# Patient Record
Sex: Female | Born: 1955
Health system: Southern US, Community
[De-identification: ages and names within clinical notes are randomized; demographics above are authoritative.]

## PROBLEM LIST (undated history)

## (undated) DIAGNOSIS — Z9889 Other specified postprocedural states: Secondary | ICD-10-CM

## (undated) DIAGNOSIS — N644 Mastodynia: Secondary | ICD-10-CM

## (undated) DIAGNOSIS — R112 Nausea with vomiting, unspecified: Secondary | ICD-10-CM

## (undated) DIAGNOSIS — D72819 Decreased white blood cell count, unspecified: Secondary | ICD-10-CM

## (undated) DIAGNOSIS — K222 Esophageal obstruction: Secondary | ICD-10-CM

## (undated) DIAGNOSIS — E785 Hyperlipidemia, unspecified: Secondary | ICD-10-CM

## (undated) DIAGNOSIS — K219 Gastro-esophageal reflux disease without esophagitis: Secondary | ICD-10-CM

## (undated) DIAGNOSIS — S52521A Torus fracture of lower end of right radius, initial encounter for closed fracture: Secondary | ICD-10-CM

## (undated) DIAGNOSIS — I10 Essential (primary) hypertension: Secondary | ICD-10-CM

## (undated) DIAGNOSIS — S52101A Unspecified fracture of upper end of right radius, initial encounter for closed fracture: Secondary | ICD-10-CM

## (undated) DIAGNOSIS — D649 Anemia, unspecified: Secondary | ICD-10-CM

## (undated) DIAGNOSIS — D131 Benign neoplasm of stomach: Secondary | ICD-10-CM

## (undated) DIAGNOSIS — Z86010 Personal history of colonic polyps: Secondary | ICD-10-CM

## (undated) HISTORY — DX: Esophageal obstruction: K21.9

## (undated) HISTORY — PX: COLONOSCOPY: SHX174

## (undated) HISTORY — DX: Hyperlipidemia, unspecified: E78.5

## (undated) HISTORY — DX: Esophageal obstruction: K22.2

## (undated) HISTORY — DX: Essential (primary) hypertension: I10

## (undated) HISTORY — PX: TONSILLECTOMY: SUR1361

## (undated) HISTORY — DX: Anemia, unspecified: D64.9

## (undated) HISTORY — DX: Decreased white blood cell count, unspecified: D72.819

## (undated) HISTORY — DX: Benign neoplasm of stomach: D13.1

---
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
## 1991-02-27 HISTORY — PX: TUBAL LIGATION: SHX77

## 1998-02-26 DIAGNOSIS — I10 Essential (primary) hypertension: Secondary | ICD-10-CM | POA: Insufficient documentation

## 1999-01-05 ENCOUNTER — Encounter: Admission: RE | Admit: 1999-01-05 | Discharge: 1999-01-05 | Payer: Self-pay | Admitting: Obstetrics and Gynecology

## 1999-01-05 ENCOUNTER — Encounter: Payer: Self-pay | Admitting: Obstetrics and Gynecology

## 2000-01-03 ENCOUNTER — Other Ambulatory Visit: Admission: RE | Admit: 2000-01-03 | Discharge: 2000-01-03 | Payer: Self-pay | Admitting: Obstetrics and Gynecology

## 2000-01-03 ENCOUNTER — Encounter (INDEPENDENT_AMBULATORY_CARE_PROVIDER_SITE_OTHER): Payer: Self-pay

## 2000-01-09 ENCOUNTER — Encounter: Payer: Self-pay | Admitting: Obstetrics and Gynecology

## 2000-01-09 ENCOUNTER — Encounter: Admission: RE | Admit: 2000-01-09 | Discharge: 2000-01-09 | Payer: Self-pay | Admitting: Obstetrics and Gynecology

## 2000-01-10 ENCOUNTER — Encounter: Admission: RE | Admit: 2000-01-10 | Discharge: 2000-01-10 | Payer: Self-pay | Admitting: Obstetrics and Gynecology

## 2000-01-10 ENCOUNTER — Encounter: Payer: Self-pay | Admitting: Obstetrics and Gynecology

## 2001-01-13 ENCOUNTER — Encounter: Payer: Self-pay | Admitting: Obstetrics and Gynecology

## 2001-01-13 ENCOUNTER — Encounter: Admission: RE | Admit: 2001-01-13 | Discharge: 2001-01-13 | Payer: Self-pay | Admitting: Obstetrics and Gynecology

## 2002-02-05 ENCOUNTER — Encounter: Admission: RE | Admit: 2002-02-05 | Discharge: 2002-02-05 | Payer: Self-pay | Admitting: Obstetrics and Gynecology

## 2002-02-05 ENCOUNTER — Encounter: Payer: Self-pay | Admitting: Obstetrics and Gynecology

## 2003-03-08 ENCOUNTER — Encounter: Admission: RE | Admit: 2003-03-08 | Discharge: 2003-03-08 | Payer: Self-pay | Admitting: Obstetrics and Gynecology

## 2004-03-30 ENCOUNTER — Ambulatory Visit (HOSPITAL_COMMUNITY): Admission: RE | Admit: 2004-03-30 | Discharge: 2004-03-30 | Payer: Self-pay | Admitting: Obstetrics and Gynecology

## 2005-04-09 ENCOUNTER — Encounter: Admission: RE | Admit: 2005-04-09 | Discharge: 2005-04-09 | Payer: Self-pay | Admitting: Obstetrics and Gynecology

## 2005-10-31 ENCOUNTER — Ambulatory Visit (HOSPITAL_COMMUNITY): Admission: RE | Admit: 2005-10-31 | Discharge: 2005-10-31 | Payer: Self-pay | Admitting: Internal Medicine

## 2006-04-15 ENCOUNTER — Ambulatory Visit (HOSPITAL_COMMUNITY): Admission: RE | Admit: 2006-04-15 | Discharge: 2006-04-15 | Payer: Self-pay | Admitting: Obstetrics and Gynecology

## 2007-04-17 ENCOUNTER — Ambulatory Visit (HOSPITAL_COMMUNITY): Admission: RE | Admit: 2007-04-17 | Discharge: 2007-04-17 | Payer: Self-pay | Admitting: Obstetrics and Gynecology

## 2008-02-27 HISTORY — PX: ESOPHAGOGASTRODUODENOSCOPY: SHX1529

## 2008-02-27 HISTORY — PX: COLONOSCOPY: SHX174

## 2008-04-19 ENCOUNTER — Ambulatory Visit (HOSPITAL_COMMUNITY): Admission: RE | Admit: 2008-04-19 | Discharge: 2008-04-19 | Payer: Self-pay | Admitting: Obstetrics and Gynecology

## 2008-05-07 ENCOUNTER — Ambulatory Visit: Payer: Self-pay | Admitting: Internal Medicine

## 2008-05-11 ENCOUNTER — Ambulatory Visit: Payer: Self-pay | Admitting: Gastroenterology

## 2008-05-11 DIAGNOSIS — R1011 Right upper quadrant pain: Secondary | ICD-10-CM | POA: Insufficient documentation

## 2008-05-12 ENCOUNTER — Ambulatory Visit (HOSPITAL_COMMUNITY): Admission: RE | Admit: 2008-05-12 | Discharge: 2008-05-12 | Payer: Self-pay | Admitting: Gastroenterology

## 2008-05-12 ENCOUNTER — Telehealth: Payer: Self-pay | Admitting: Internal Medicine

## 2008-05-13 ENCOUNTER — Ambulatory Visit: Payer: Self-pay | Admitting: Nurse Practitioner

## 2008-05-13 LAB — CONVERTED CEMR LAB
Anti Nuclear Antibody(ANA): NEGATIVE
HCV Ab: NEGATIVE

## 2008-05-14 ENCOUNTER — Telehealth: Payer: Self-pay | Admitting: Nurse Practitioner

## 2008-05-14 LAB — CONVERTED CEMR LAB
Albumin: 4.1 g/dL (ref 3.5–5.2)
Alkaline Phosphatase: 56 units/L (ref 39–117)
Bilirubin, Direct: 0.1 mg/dL (ref 0.0–0.3)
Ferritin: 49.2 ng/mL (ref 10.0–291.0)
HCT: 37.8 % (ref 36.0–46.0)
Iron: 58 ug/dL (ref 42–145)
Lymphs Abs: 1.6 10*3/uL (ref 0.7–4.0)
MCHC: 34.2 g/dL (ref 30.0–36.0)
MCV: 92.1 fL (ref 78.0–100.0)
Monocytes Absolute: 0.6 10*3/uL (ref 0.1–1.0)
Neutrophils Relative %: 19.1 % — ABNORMAL LOW (ref 43.0–77.0)
Platelets: 295 10*3/uL (ref 150.0–400.0)
RDW: 11.9 % (ref 11.5–14.6)
Saturation Ratios: 15.4 % — ABNORMAL LOW (ref 20.0–50.0)
Total Bilirubin: 0.8 mg/dL (ref 0.3–1.2)
Transferrin: 268.6 mg/dL (ref 212.0–360.0)
WBC: 2.9 10*3/uL — ABNORMAL LOW (ref 4.5–10.5)

## 2008-05-18 ENCOUNTER — Ambulatory Visit: Payer: Self-pay | Admitting: Internal Medicine

## 2008-05-21 ENCOUNTER — Ambulatory Visit: Payer: Self-pay | Admitting: Internal Medicine

## 2008-05-24 ENCOUNTER — Telehealth (INDEPENDENT_AMBULATORY_CARE_PROVIDER_SITE_OTHER): Payer: Self-pay | Admitting: *Deleted

## 2008-08-02 ENCOUNTER — Ambulatory Visit: Payer: Self-pay | Admitting: Internal Medicine

## 2008-12-20 DIAGNOSIS — M1990 Unspecified osteoarthritis, unspecified site: Secondary | ICD-10-CM | POA: Insufficient documentation

## 2008-12-20 DIAGNOSIS — Z780 Asymptomatic menopausal state: Secondary | ICD-10-CM | POA: Insufficient documentation

## 2008-12-20 DIAGNOSIS — I10 Essential (primary) hypertension: Secondary | ICD-10-CM | POA: Insufficient documentation

## 2008-12-20 DIAGNOSIS — E785 Hyperlipidemia, unspecified: Secondary | ICD-10-CM | POA: Insufficient documentation

## 2008-12-28 DIAGNOSIS — K760 Fatty (change of) liver, not elsewhere classified: Secondary | ICD-10-CM | POA: Insufficient documentation

## 2008-12-28 DIAGNOSIS — D229 Melanocytic nevi, unspecified: Secondary | ICD-10-CM | POA: Insufficient documentation

## 2009-04-20 ENCOUNTER — Ambulatory Visit (HOSPITAL_COMMUNITY): Admission: RE | Admit: 2009-04-20 | Discharge: 2009-04-20 | Payer: Self-pay | Admitting: Obstetrics and Gynecology

## 2009-10-13 DIAGNOSIS — H8110 Benign paroxysmal vertigo, unspecified ear: Secondary | ICD-10-CM | POA: Insufficient documentation

## 2010-03-16 ENCOUNTER — Other Ambulatory Visit (HOSPITAL_COMMUNITY): Payer: Self-pay | Admitting: Obstetrics and Gynecology

## 2010-03-16 DIAGNOSIS — Z1231 Encounter for screening mammogram for malignant neoplasm of breast: Secondary | ICD-10-CM

## 2010-03-16 DIAGNOSIS — Z0000 Encounter for general adult medical examination without abnormal findings: Secondary | ICD-10-CM

## 2010-04-21 ENCOUNTER — Ambulatory Visit (HOSPITAL_COMMUNITY)
Admission: RE | Admit: 2010-04-21 | Discharge: 2010-04-21 | Disposition: A | Discharge status: Home / Self Care | Payer: 59 | Source: Ambulatory Visit | Attending: Obstetrics and Gynecology | Admitting: Obstetrics and Gynecology

## 2010-04-21 DIAGNOSIS — Z1231 Encounter for screening mammogram for malignant neoplasm of breast: Secondary | ICD-10-CM | POA: Insufficient documentation

## 2011-01-25 ENCOUNTER — Telehealth: Payer: Self-pay | Admitting: Oncology

## 2011-01-25 NOTE — Telephone Encounter (Signed)
S/w the pt and she is aware of the new pt appts on 01/31/2011 with dr Clelia Croft. Pt requested a sooner appt than the week of 02/05/2011. Pt stated she has already waited over two weeks for this appt.

## 2011-01-26 ENCOUNTER — Telehealth: Payer: Self-pay | Admitting: *Deleted

## 2011-01-26 NOTE — Telephone Encounter (Signed)
Pt aware of 01-31-11 appointment

## 2011-01-29 ENCOUNTER — Other Ambulatory Visit: Payer: Self-pay

## 2011-01-31 ENCOUNTER — Other Ambulatory Visit: Payer: 59 | Admitting: Lab

## 2011-01-31 ENCOUNTER — Ambulatory Visit: Payer: 59

## 2011-01-31 ENCOUNTER — Other Ambulatory Visit (HOSPITAL_BASED_OUTPATIENT_CLINIC_OR_DEPARTMENT_OTHER): Payer: Commercial Managed Care - PPO | Admitting: Lab

## 2011-01-31 ENCOUNTER — Encounter: Payer: Self-pay | Admitting: Family

## 2011-01-31 ENCOUNTER — Ambulatory Visit: Payer: 59 | Admitting: Oncology

## 2011-01-31 ENCOUNTER — Ambulatory Visit (HOSPITAL_BASED_OUTPATIENT_CLINIC_OR_DEPARTMENT_OTHER): Payer: 59

## 2011-01-31 ENCOUNTER — Encounter: Payer: Self-pay | Admitting: Hematology & Oncology

## 2011-01-31 ENCOUNTER — Ambulatory Visit (HOSPITAL_BASED_OUTPATIENT_CLINIC_OR_DEPARTMENT_OTHER): Payer: Commercial Managed Care - PPO | Admitting: Hematology & Oncology

## 2011-01-31 ENCOUNTER — Ambulatory Visit: Payer: 59 | Admitting: Family

## 2011-01-31 VITALS — BP 141/89 | HR 93 | Temp 97.9°F | Ht 62.5 in | Wt 188.0 lb

## 2011-01-31 DIAGNOSIS — D72819 Decreased white blood cell count, unspecified: Secondary | ICD-10-CM | POA: Insufficient documentation

## 2011-01-31 HISTORY — DX: Decreased white blood cell count, unspecified: D72.819

## 2011-01-31 LAB — CBC WITH DIFFERENTIAL (CANCER CENTER ONLY)
EOS%: 2.2 % (ref 0.0–7.0)
HGB: 13.6 g/dL (ref 11.6–15.9)
LYMPH#: 1.9 10*3/uL (ref 0.9–3.3)
LYMPH%: 45.9 % (ref 14.0–48.0)
MCH: 30.2 pg (ref 26.0–34.0)
MCHC: 34.9 g/dL (ref 32.0–36.0)
MONO%: 18.4 % — ABNORMAL HIGH (ref 0.0–13.0)
NEUT%: 33 % — ABNORMAL LOW (ref 39.6–80.0)
RDW: 13.3 % (ref 11.1–15.7)

## 2011-01-31 LAB — CHCC SATELLITE - SMEAR

## 2011-01-31 NOTE — Progress Notes (Signed)
Pt seen with Dr. Myna Hidalgo as a new pt. Note  And LOS will be added by him.

## 2011-02-01 LAB — ANA: Anti Nuclear Antibody(ANA): NEGATIVE

## 2011-02-01 NOTE — Progress Notes (Signed)
CC:   [name], MD  DIAGNOSIS:  Transient leukopenia.  HISTORY OF PRESENT ILLNESS:  Ms. Burditt is a very nice 55 year old white female.  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  She actually went to high school with my wife at [employer].  The patient is followed by Dr. Timothy Lasso.  Dr. Timothy Lasso has noted that she has had progressive leukopenia over the past few months.  Ms. Fauteux has felt well.  She has not noticed any problems with recurrent infections.  She has had no change in her medications.  There have been no rashes.  She does not feel any swollen lymph nodes.  There has been no foreign travel.  In going through her records, back on 01/03/2011, her white cell count was 2.5.  Her hemoglobin was 13.5; hematocrit was 39.3.  Platelet count was 344.  Her white cell differential showed 22 segs, 16 lymphocytes.  She had normal electrolytes.  LFTs were normal.  Again, she had not had any issues with respect to infections.  Dr. Timothy Lasso felt that hematologic evaluation was indicated.  He kindly sent her to the [facility] for evaluation.  Of note, going back through the record, back in March 2010, her white cell count was 2.9.  PAST MEDICAL HISTORY: 1. Remarkable for hypertension. 2. Hyperlipidemia. 3. Irritable bowel syndrome. 4. Fatty liver.  ALLERGIES:  SULFA MEDICATIONS.  MEDICATIONS: 1. Crestor 5 mg p.o. daily. 2. Micardis 80 mg p.o. daily. 3. Norvasc 2.5 mg p.o. daily.  SOCIAL HISTORY:  Negative for tobacco use.  There is no alcohol use. She has no obvious occupational exposures.  FAMILY HISTORY:  Relatively noncontributory.  She is not aware of any blood issues within her family.  She has 2 kids who are quite healthy. Her mother did have a history of high blood pressure and a history of breast cancer.  REVIEW OF SYSTEMS:  As stated in the history of present illness.  Her last mammogram was back in February 2012.  Her last  colonoscopy was back in March of 2010.  PHYSICAL EXAM:  General:  This is a well-developed well-nourished white female in no obvious distress.  Vital Signs:  Temperature of 97.9, pulse 93, respiratory rate 18, blood pressure 141/89.  Weight is 188. Head/Neck:  Exam shows a normocephalic, atraumatic skull.  There are no ocular or oral lesions.  There are no palpable cervical or supraclavicular lymph nodes.  Lungs:  Clear bilaterally.  No rales, wheezes or rhonchi.  Cardiac:  Regular rate and rhythm with a normal S1, S2.  There are no murmurs, rubs, or bruits.  Abdomen:  Soft with good bowel sounds.  There is no palpable abdominal mass.  There is no fluid wave.  There is no palpable hepatosplenomegaly.  Back:  No tenderness of the spine, ribs, or hips.  Extremities:  No clubbing, cyanosis or edema. She has good range of motion of her joints.  She has good pulses in her distal extremities.  There is no joint swelling, erythema or warmth. Axillae:  Axillary exam shows no bilateral axillary adenopathy. Neurologic:  Exam shows no focal neurological deficits.  Skin:  Exam shows no rashes, ecchymosis or petechiae.  LABORATORY STUDIES:  White cell count 4.1, hemoglobin 13.6, hematocrit 39, platelet count 354.  MCV is 87.  White cell differential shows 33 segs, 46 lymphocytes, 18 monos.  Her ANA is negative.  Her rheumatoid factor is also negative.  Peripheral smear shows a normochromic, normocytic  population of red blood cells.  There are no nucleated red blood cells.  I see no teardrop cells.  She has no rouleaux formation. There are no target cells.  White cells appear normal morphology and maturation.  There is an increase in lymphocytes.  Lymphocytes appear normal without any large granules or atypia.  There may be a slight increase in monocytes.  I do not see any hypersegmented polys.  There are no blasts.  Platelets are adequate number and size.  Platelets are well  granulated.  IMPRESSION:  Ms. Swinford is a very nice 55 year old white female.  Again, she went to high school with my wife.  It was fun talking to her about that.  I really do not see anything with respect to her exam or her blood smear that looks unusual.  She does have an increase in lymphocytes and monocytes.  I am wondering if she may have had, for some reason, marrow suppression.  It is hard to say what might have caused that.  She may have had some kind of transient viral syndrome that could have led to the leukopenia.  The monocytosis would go along with a viral type process.  Again, I really do not see anything on her exam that looks suspicious. She does not have hepatosplenomegaly, lymphadenopathy, or any unusual rashes.  The lab work that we have on her so far looks fine.  I do not see an indication for a bone marrow test.  I think we could be conservative and just follow Ms. Aguilar.  I want to see her back in about 3 or 4 months.  If we find that her blood counts are stable or better, then we can hopefully let her go from the clinic as we really would not be adding much to her medical care.  I did give Ms. Napier a prayer blanket.  She very much appreciated this. She has a strong faith.  We did have good fellowship during our office meeting.  I spent a good hour so with Ms. Cavendish.  Again, it was fun talking to her.    ______________________________ Josph Macho, M.D. PRE/MEDQ  D:  02/01/2011  T:  02/01/2011  Job:  [id number]   ADDENDUM:  (-) ANA and RF.

## 2011-02-01 NOTE — Progress Notes (Signed)
This office note has been dictated.

## 2011-02-02 ENCOUNTER — Telehealth: Payer: Self-pay | Admitting: *Deleted

## 2011-02-02 NOTE — Telephone Encounter (Signed)
Mailed 04-2011 schedule

## 2011-02-07 ENCOUNTER — Ambulatory Visit: Payer: 59

## 2011-02-07 ENCOUNTER — Ambulatory Visit: Payer: 59 | Admitting: Oncology

## 2011-02-07 ENCOUNTER — Other Ambulatory Visit: Payer: 59 | Admitting: Lab

## 2011-02-07 ENCOUNTER — Other Ambulatory Visit: Payer: Self-pay | Admitting: Obstetrics and Gynecology

## 2011-02-07 DIAGNOSIS — N644 Mastodynia: Secondary | ICD-10-CM

## 2011-02-07 DIAGNOSIS — N63 Unspecified lump in unspecified breast: Secondary | ICD-10-CM

## 2011-02-08 ENCOUNTER — Ambulatory Visit
Admission: RE | Admit: 2011-02-08 | Discharge: 2011-02-08 | Disposition: A | Discharge status: Home / Self Care | Payer: 59 | Source: Ambulatory Visit | Attending: Obstetrics and Gynecology | Admitting: Obstetrics and Gynecology

## 2011-02-08 DIAGNOSIS — N63 Unspecified lump in unspecified breast: Secondary | ICD-10-CM

## 2011-02-08 DIAGNOSIS — N644 Mastodynia: Secondary | ICD-10-CM

## 2011-03-20 ENCOUNTER — Ambulatory Visit (INDEPENDENT_AMBULATORY_CARE_PROVIDER_SITE_OTHER): Payer: Commercial Managed Care - PPO | Admitting: Surgery

## 2011-03-20 ENCOUNTER — Encounter (INDEPENDENT_AMBULATORY_CARE_PROVIDER_SITE_OTHER): Payer: Self-pay | Admitting: Surgery

## 2011-03-20 VITALS — BP 128/86 | HR 76 | Temp 97.7°F | Resp 18 | Ht 64.0 in | Wt 190.0 lb

## 2011-03-20 DIAGNOSIS — N644 Mastodynia: Secondary | ICD-10-CM | POA: Insufficient documentation

## 2011-03-20 HISTORY — DX: Mastodynia: N64.4

## 2011-03-20 NOTE — Progress Notes (Signed)
NAME: Jasmine Castro                                                                                      DOB: [birth year] DATE: 03/20/2011               MRN: [id number]   CC:  Chief Complaint  Patient presents with  . Breast Pain    new pt- eval lt breast tenderness and thickness    HPI:  Jasmine Castro is a 56 y.o.  female who was referred  by Dr. Alric Seton evaluation of Left breast pain. The pain appears to be in the lateral aspect of her left breast. It's been present for about 3-4 months but was worse around Thanksgiving, two months ago. She's had two exams by her gynecologist as well as a diagnostic mammogram and ultrasound. We are asked to see her for further evaluation.  She's never had any significant problems with her breasts. She's had no nipple discharge, I felt any lumps, and noticed no skin changes.  Her mother was diagnosed with breast cancer at age 34.  PMH:  has a past medical history of Leukopenia (01/31/2011); Anemia; Hyperlipidemia; and Hypertension.  PSH:   has past surgical history that includes Tubal ligation (1993) and Tonsillectomy.  ALLERGIES:   Allergies  Allergen Reactions  . Sulfonamide Derivatives     REACTION: swelling    MEDICATIONS: Current outpatient prescriptions:CALCIUM PO, Take by mouth daily., Disp: , Rfl: ;  Cholecalciferol (VITAMIN D PO), Take by mouth daily., Disp: , Rfl: ;  Multiple Vitamin (MULTIVITAMIN) capsule, Take 1 capsule by mouth daily., Disp: , Rfl: ;  rosuvastatin (CRESTOR) 10 MG tablet, Take 10 mg by mouth daily., Disp: , Rfl: ;  telmisartan (MICARDIS) 80 MG tablet, Take 80 mg by mouth daily., Disp: , Rfl: ;  VITAMIN E PO, Take by mouth daily., Disp: , Rfl:   ROS: Basically negative. She was evaluated for leukopenia  EXAM:   General: The patient is alert oriented and healthy-appearing no distress and normal mood and affect except for some mild anxiety about her breast tissue. Breasts: The breasts are symmetric in appearance. They are  slightly dense. There is no dominant mass on either side. She is notably tender on the lateral aspect of the left breast especially the upper outer quadrant. However I did not appreciate any mass there. There are no skin nipple or areolar changes noted.  Lymphatics: There is no axillary adenopathy noted.  DATA REVIEWED:  Mammogram and son reports and Info in Epic  IMPRESSION:  F/C changes and pain/mastodynia  PLAN:   Told her this should improve with time. No specific treatment. She should continue BSE and see me if symptoms worsen or she feels a lump.  Irish Breisch J 03/20/2011  CC: Jasmine Pounds, MD, Jasmine Pounds, MD, MD

## 2011-05-01 ENCOUNTER — Other Ambulatory Visit (HOSPITAL_COMMUNITY): Payer: Self-pay | Admitting: Obstetrics and Gynecology

## 2011-05-01 DIAGNOSIS — Z1231 Encounter for screening mammogram for malignant neoplasm of breast: Secondary | ICD-10-CM

## 2011-05-09 ENCOUNTER — Other Ambulatory Visit (HOSPITAL_BASED_OUTPATIENT_CLINIC_OR_DEPARTMENT_OTHER): Payer: Commercial Managed Care - PPO | Admitting: Lab

## 2011-05-09 ENCOUNTER — Ambulatory Visit (HOSPITAL_BASED_OUTPATIENT_CLINIC_OR_DEPARTMENT_OTHER): Payer: Commercial Managed Care - PPO | Admitting: Hematology & Oncology

## 2011-05-09 VITALS — BP 125/77 | HR 81 | Temp 97.0°F | Ht 64.0 in | Wt 195.0 lb

## 2011-05-09 DIAGNOSIS — D72819 Decreased white blood cell count, unspecified: Secondary | ICD-10-CM

## 2011-05-09 LAB — CBC WITH DIFFERENTIAL (CANCER CENTER ONLY)
BASO%: 0.8 % (ref 0.0–2.0)
EOS%: 4.7 % (ref 0.0–7.0)
LYMPH#: 1.5 10*3/uL (ref 0.9–3.3)
MCH: 30.4 pg (ref 26.0–34.0)
MCHC: 34.4 g/dL (ref 32.0–36.0)
MONO%: 20.6 % — ABNORMAL HIGH (ref 0.0–13.0)
NEUT#: 0.4 10*3/uL — CL (ref 1.5–6.5)
Platelets: 277 10*3/uL (ref 145–400)
RDW: 12.4 % (ref 11.1–15.7)

## 2011-05-09 LAB — CHCC SATELLITE - SMEAR

## 2011-05-09 NOTE — Progress Notes (Signed)
Diagnosis: Leukopenia   Current therapy: Observation  Interim history: Jasmine Castro comes in for followup. We have first saw her back in early December. At that time, as she had mild leukopenia.  Overall all of her lab work did not show any obvious etiology for the leukopenia. Her ANA was negative. The rheumatoid factor titer was less than 10.  She's had no problems with arthralgias or myalgias. She still working for the [employer]. She's had no fever sweats or chills. He is having no nausea vomiting. There's been no change in her  Her physical exam this is a well-developed well-nourished white female in no obvious distress. Vital signs 97.0 pulse 81. Heart rate 16. Blood pressure 125/80. Weight is a 195 pounds.  Her head and neck exam shows no ocular oral lesions. There is no scleral icterus. There is no adenopathy in her neck. Thyroid is nonpalpable. Lungs are clear bilaterally. Cardiac exam regular rhythm with no murmurs goes or bruits. Abdominal exam soft with good bowel sounds. She has no fluid wave. As a probable hepato-splenomegaly. Extremities shows no clubbing cyanosis or edema. Skin exam shows no rashes ecchymosis or petechia.   Her laboratory studies showed a white cell count of 2.5 hemoglobin 13.3 hematocrit 38.7 platelet count 277. White cell differential shows 16 segs 58 lymphs 21 monos.  Her blood smear shows mature white cells. She has an increase in mature lymphocytes. Her monocytes are increased a little. I do not see any hyper segmented polys. There is no blasts.  Impression: Jasmine Castro is a 56 year old white female with a leukopenia. I believe this is a autoimmune or type phenomenon. I still do not believe that a bone marrow needs to be done. I didn't believe that she just is destroying her white blood cells sooner than the norm.  I want to see her back in another couple months. I don't see that we have to make any changes to her medicines or to her program.

## 2011-05-25 ENCOUNTER — Ambulatory Visit (HOSPITAL_COMMUNITY)
Admission: RE | Admit: 2011-05-25 | Discharge: 2011-05-25 | Disposition: A | Discharge status: Home / Self Care | Payer: 59 | Source: Ambulatory Visit | Attending: Obstetrics and Gynecology | Admitting: Obstetrics and Gynecology

## 2011-05-25 DIAGNOSIS — Z1231 Encounter for screening mammogram for malignant neoplasm of breast: Secondary | ICD-10-CM | POA: Insufficient documentation

## 2011-06-02 ENCOUNTER — Encounter: Payer: 59 | Attending: Internal Medicine | Admitting: Dietician

## 2011-06-02 ENCOUNTER — Encounter: Payer: Self-pay | Admitting: Dietician

## 2011-06-02 NOTE — Progress Notes (Signed)
Patient was seen on 06/02/2011 for the complete series of three diabetes self-management courses at the [facility]. The following learning objectives were met by the patient during this course:  Core 1:   Gain an understanding of diabetes and what causes it  Learn how diabetes is treated and the goals of treatment  Learn why, how and when to test BG  Learn how carbohydrate affects your glucose  Receive a personal food plan and learn how to count carbohydrates  Discover how physical activity enhances glucose control and overall health  Begin to gain confidence in your ability to manage diabetes  Handouts given during this class include:  Type 2 Diabetes: Basics Book  My Food Plan Book  Food and Activity Log  Core 2:   Describe causes, symptoms and treatment of hypoglycemia and hyperglycemia  Learn how to care for your glucose meter and strips  Explain how to manage diabetes during illness  List strategies to follow meal plan when dining out  Describe the effects of alcohol on glucose and how to use it safely  Describe problem solving skills for day-to-day glucose challenges  Describe ways to remain physically active  Describe the impact of regular activity on insulin resistance  Handouts given in this class:  Refrigerator magnet for Sick Day Guidelines  [facility] Oral Medication and Insulin handout  Core 3   Describe how diabetes changes over time  Understand why glucose may be out of target  Learn how diabetes changes over time  Learn about blood pressure, cholesterol, and heart health  Learn about lowering dietary fat and sodium  Understand the benefits of physical activity for heart health  Develop problem solving skills for times when glucose numbers are puzzling  Develop strategies for creating life balance  Learn how to identify if your treatment plan needs to change  Develop strategies for dealing with stress,  depression and staying motivated  Identify healthy weight-loss plans  Gain confidence that you can succeed in caring for your diabetes  Establish 2-3 goals that they will plan to diligently work on until they return                   for the free 22-month follow-up visit    The following handouts were given in class:  3 Month Follow Up Visit handout  Goal setting handout  Class evaluation form  Your patient has established the following 3 month goals for diabetes self-care:  Count carbohydrates at most of my meals and snacks.  Reduce fat in my diet at two or more meals a day.  Practice portion control.  Be active 30 minutes or more 3 times a week.  To help manage my stress I will relax/laugh (working puzzles, listening to music, reading at least 3 times a week.  Follow-Up Plan: Patient was offered a 3 month follow-up visit for diabetes self-management education.

## 2011-06-30 ENCOUNTER — Ambulatory Visit: Payer: Commercial Managed Care - PPO

## 2011-07-16 ENCOUNTER — Other Ambulatory Visit (HOSPITAL_BASED_OUTPATIENT_CLINIC_OR_DEPARTMENT_OTHER): Payer: 59 | Admitting: Lab

## 2011-07-16 ENCOUNTER — Ambulatory Visit (HOSPITAL_BASED_OUTPATIENT_CLINIC_OR_DEPARTMENT_OTHER): Payer: 59 | Admitting: Hematology & Oncology

## 2011-07-16 VITALS — BP 121/75 | HR 66 | Temp 96.9°F | Ht 64.0 in | Wt 183.0 lb

## 2011-07-16 DIAGNOSIS — D72819 Decreased white blood cell count, unspecified: Secondary | ICD-10-CM

## 2011-07-16 LAB — CBC WITH DIFFERENTIAL (CANCER CENTER ONLY)
BASO#: 0 10*3/uL (ref 0.0–0.2)
BASO%: 1.4 % (ref 0.0–2.0)
HCT: 38.9 % (ref 34.8–46.6)
HGB: 13.4 g/dL (ref 11.6–15.9)
LYMPH#: 1.2 10*3/uL (ref 0.9–3.3)
MONO#: 0.6 10*3/uL (ref 0.1–0.9)
NEUT#: 0.3 10*3/uL — CL (ref 1.5–6.5)
NEUT%: 14.8 % — ABNORMAL LOW (ref 39.6–80.0)
RDW: 11.9 % (ref 11.1–15.7)
WBC: 2.2 10*3/uL — ABNORMAL LOW (ref 3.9–10.0)

## 2011-07-16 NOTE — Progress Notes (Signed)
CC:   Jasmine Pounds, MD  DIAGNOSIS:  Leukopenia.  CURRENT THERAPY:  Observation.  INTERIM HISTORY:  Jasmine Castro comes in for followup.  She is having no problems since we last saw her.  She has had no problem with infections. There has been no fever, sweats, or chills.  There has been no rashes. She has had no change in medications.  When we initially saw her, all of her lab work looked okay.  She had a negative ANA titer.  She was negative for rheumatoid factor and antigen.  She is eating okay.  There has been no change in bowel or bladder habits.  PHYSICAL EXAMINATION:  General:  This is a well-developed, well- nourished, white female in no obvious distress.  Vital Signs:  96.9, pulse 66, respiratory rate 20, blood pressure 121/75.  Weight is 183. Head and Neck:  Normocephalic, atraumatic skull.  There are no ocular or oral lesions.  There are no palpable cervical or supraclavicular lymph nodes.  Lungs:  Clear bilaterally.  Cardiac:  Regular rate and rhythm with a normal S1 and S2.  There are no murmurs, rubs, or bruits. Abdominal:  Soft with good bowel sounds.  There is no palpable abdominal mass.  There is no fluid wave.  There is no palpable hepatosplenomegaly. Back:  No tenderness over the spine, ribs, or hips.  Extremities:  No clubbing, cyanosis, or edema.  Neurological:  No focal neurological deficits.  Skin:  No rashes, ecchymoses, or petechia.  LABORATORY STUDIES:  White cell count is 2.2, hemoglobin 13.4, hematocrit 38.9, platelet count 286.  White cell differential shows 15 segs, 54 lymphs, 26 monos.  Peripheral smear shows a decrease in neutrophils.  She has had mature- appearing neutrophils.  She has no hypersegmented polys.  She has no immature myeloid cells.  I do not see any promyelocytes.  She has monocytes which appear to be mature.  There are no atypical lymphocytes. I see no large granular lymphocytes.  Red cells are without nucleated red blood cells.  There  are no target cells.  I see no rouleaux formation.  Platelets are adequate in number and size.  IMPRESSION:  Jasmine Castro is a 56 year old white female with leukopenia. When I first her back in December, her white cell count was 4.1.  She had a relatively normal white cell differential.  However, her white cell count has continued to trend downward.  In addition, she has a decrease in her number of neutrophils.  I wonder if this is some kind of cyclical neutropenia.  Typically with this, one would expect the neutrophils to be a whole lot lower.  I still do not see any large granular lymphocytes which can be implicated with neutropenia.  I would not think this is myelodysplasia. Again, I do not see anything unusual on the blood smear outside of the fact that she does have decreased immature-appearing neutrophils.  I have a sense that we are probably going to end up having to do a bone marrow test on Jasmine Castro.  I would prefer not to and I think that we can get her back in 2 months' time before we have to make a decision.  She is still asymptomatic.  I spent a good half-hour so with Jasmine Castro.  I tried to "prepare" her for the possibility that we are going to have to do a bone marrow test.  I want to see her back in 2 months' time.    ______________________________ Rose Phi  Myna Hidalgo, M.D. PRE/MEDQ  D:  07/16/2011  T:  07/16/2011  Job:  [id number]

## 2011-07-16 NOTE — Progress Notes (Signed)
This office note has been dictated.

## 2011-09-10 ENCOUNTER — Telehealth: Payer: Self-pay | Admitting: Hematology & Oncology

## 2011-09-10 NOTE — Telephone Encounter (Signed)
Pt cx 7-18 had death in family will call back to reschedule

## 2011-09-13 ENCOUNTER — Other Ambulatory Visit: Payer: 59 | Admitting: Lab

## 2011-09-13 ENCOUNTER — Ambulatory Visit: Payer: 59 | Admitting: Hematology & Oncology

## 2011-09-18 ENCOUNTER — Ambulatory Visit: Payer: 59 | Admitting: Dietician

## 2011-09-19 ENCOUNTER — Encounter: Payer: 59 | Attending: Internal Medicine | Admitting: Dietician

## 2011-09-19 VITALS — Ht 64.0 in | Wt 183.8 lb

## 2011-09-19 DIAGNOSIS — R7303 Prediabetes: Secondary | ICD-10-CM

## 2011-09-19 DIAGNOSIS — R7309 Other abnormal glucose: Secondary | ICD-10-CM | POA: Insufficient documentation

## 2011-09-23 ENCOUNTER — Encounter: Payer: Self-pay | Admitting: Dietician

## 2011-09-23 NOTE — Progress Notes (Signed)
  Patient was seen on 09/19/2011 for their 3 month follow-up as a part of the diabetes self-management courses at the [facility]. The following learning objectives were met by your patient during this course:  Patient self reports the following:  Diabetes control has improved since diabetes self-management training: yes   Has lost 10.7 lb since her class on 06/02/2011. Number of days blood glucose is >200: NA  Is not currently checking blood glucose levels.  No recent A1C.  Last MD appointment for diabetes: Last November is due August 13 th 2013. Changes in treatment plan: none Confidence with ability to manage diabetes: yes Areas for improvement with diabetes self-care:  1.  Increase my exercise to 2x/day for short periods of time 2.  Cut my calories 1-2 days per week to 900 calories to help with losing weight  3.  Read my labels more often 4.  Work on better portion control Willingness to participate in diabetes support group: unsure related to time.  Please see Diabetes Flow sheet for findings related to patient's self-care.  Follow-Up Plan: Patient is eligible for a "free" 30 minute diabetes self-care appointment in the next year. Patient to call and schedule as needed.

## 2011-10-09 ENCOUNTER — Other Ambulatory Visit (HOSPITAL_BASED_OUTPATIENT_CLINIC_OR_DEPARTMENT_OTHER): Payer: 59 | Admitting: Lab

## 2011-10-09 ENCOUNTER — Ambulatory Visit (HOSPITAL_BASED_OUTPATIENT_CLINIC_OR_DEPARTMENT_OTHER): Payer: 59 | Admitting: Hematology & Oncology

## 2011-10-09 VITALS — BP 135/83 | HR 67 | Temp 97.2°F | Resp 20 | Ht 64.0 in | Wt 183.0 lb

## 2011-10-09 DIAGNOSIS — D72819 Decreased white blood cell count, unspecified: Secondary | ICD-10-CM

## 2011-10-09 LAB — CBC WITH DIFFERENTIAL (CANCER CENTER ONLY)
BASO#: 0 10*3/uL (ref 0.0–0.2)
Eosinophils Absolute: 0.1 10*3/uL (ref 0.0–0.5)
HGB: 13.1 g/dL (ref 11.6–15.9)
MCH: 31.3 pg (ref 26.0–34.0)
MONO#: 0.5 10*3/uL (ref 0.1–0.9)
MONO%: 20.2 % — ABNORMAL HIGH (ref 0.0–13.0)
NEUT#: 0.6 10*3/uL — ABNORMAL LOW (ref 1.5–6.5)
RBC: 4.19 10*6/uL (ref 3.70–5.32)
WBC: 2.6 10*3/uL — ABNORMAL LOW (ref 3.9–10.0)

## 2011-10-09 LAB — CHCC SATELLITE - SMEAR

## 2011-10-09 NOTE — Progress Notes (Signed)
This office note has been dictated.

## 2011-10-09 NOTE — Progress Notes (Signed)
CC:   [name], MD  DIAGNOSIS:  Chronic leukopenia.  CURRENT THERAPY:  Observation.  INTERIM HISTORY:  Ms. Sommerville comes in for followup.  She is feeling well.  I last saw her back in May.  She has had no complaints since we last saw her.  There has been no problem with fevers.  She has had no rashes.  There has been no arthralgias or myalgias.  She has had no nausea or vomiting.  There has been no change in bowel or bladder habits.  She is getting ready to go to the beach with her family.  She is looking forward to this.  She works for the [employer].  She is thankful that she still has a job.  PHYSICAL EXAMINATION:  General:  This is a well-developed, well- nourished white female in no obvious distress.  Vital signs:  97.2, pulse 67, respiratory rate 20, blood pressure is 135/83.  Weight is 183. Head and neck:  Normocephalic, atraumatic skull.  There are no ocular or oral lesions.  There are no palpable cervical or supraclavicular lymph nodes.  Lungs:  Clear to percussion and auscultation bilaterally. Cardiac:  Regular rate and rhythm with a normal S1 and S2.  There are no murmurs, rubs or bruits.  Abdomen:  Soft with good bowel sounds.  There is no palpable abdominal mass.  There is no palpable hepatosplenomegaly. Back:  No tenderness of the spine, ribs or hips.  Extremities:  Shows no clubbing, cyanosis or edema.  Skin:  Shows no rashes, ecchymoses or petechiae.  LABORATORY STUDIES:  White cell count is 2.6, hemoglobin 13.1, hematocrit 37.8, platelet count 250.  MCV is 90.  IMPRESSION:  Ms. Dresden is a 56 year old white female with leukopenia. She is doing well with this.  Her white cell count is holding fairly stable.  We will need to look at her blood smear.  This is being made for [place] right now.  I still do not see a need for a bone marrow biopsy on her.  We will go ahead and plan to get her back to see [place] in another 3 months. I do not see that we need to  do any blood work in-between visits.   ______________________________ Josph Macho, M.D. PRE/MEDQ  D:  10/09/2011  T:  10/09/2011  Job:  [id number]

## 2011-10-10 ENCOUNTER — Telehealth: Payer: Self-pay | Admitting: Hematology & Oncology

## 2011-10-10 NOTE — Telephone Encounter (Signed)
Patient called and cx 01/09/12 and resch for 01/16/12

## 2012-01-09 ENCOUNTER — Other Ambulatory Visit: Payer: 59 | Admitting: Lab

## 2012-01-09 ENCOUNTER — Ambulatory Visit: Payer: 59 | Admitting: Hematology & Oncology

## 2012-01-16 ENCOUNTER — Other Ambulatory Visit (HOSPITAL_BASED_OUTPATIENT_CLINIC_OR_DEPARTMENT_OTHER): Payer: 59 | Admitting: Lab

## 2012-01-16 ENCOUNTER — Ambulatory Visit (HOSPITAL_BASED_OUTPATIENT_CLINIC_OR_DEPARTMENT_OTHER): Payer: 59 | Admitting: Hematology & Oncology

## 2012-01-16 VITALS — BP 113/52 | HR 86 | Temp 97.8°F | Resp 16 | Ht 64.0 in | Wt 186.0 lb

## 2012-01-16 DIAGNOSIS — D72819 Decreased white blood cell count, unspecified: Secondary | ICD-10-CM

## 2012-01-16 LAB — CBC WITH DIFFERENTIAL (CANCER CENTER ONLY)
BASO#: 0 10*3/uL (ref 0.0–0.2)
Eosinophils Absolute: 0.1 10*3/uL (ref 0.0–0.5)
HCT: 40.3 % (ref 34.8–46.6)
HGB: 14 g/dL (ref 11.6–15.9)
LYMPH#: 1.6 10*3/uL (ref 0.9–3.3)
LYMPH%: 59.8 % — ABNORMAL HIGH (ref 14.0–48.0)
MCV: 89 fL (ref 81–101)
MONO#: 0.6 10*3/uL (ref 0.1–0.9)
NEUT%: 13.9 % — ABNORMAL LOW (ref 39.6–80.0)
RBC: 4.51 10*6/uL (ref 3.70–5.32)
RDW: 11.8 % (ref 11.1–15.7)
WBC: 2.6 10*3/uL — ABNORMAL LOW (ref 3.9–10.0)

## 2012-01-16 NOTE — Progress Notes (Signed)
This office note has been dictated.

## 2012-01-17 NOTE — Progress Notes (Signed)
CC:   [name], MD  DIAGNOSIS:  Chronic leukopenia.  CURRENT THERAPY:  Observation.  INTERIM HISTORY:  Ms. Boward comes in for followup.  She is doing well. She is still working without any difficulties.  She is working for the [employer].  She has had no problem with infections.  There have been no rashes.  She has had no joint aches or pains.  She has had no change in bowel or bladder habits.  PHYSICAL EXAMINATION:  General:  This is a well-developed, well- nourished white female in no obvious distress.  Vital signs: Temperature of 97.8, pulse 86, respiratory rate 16, blood pressure 113/52.  Weight is 186.  Head and neck:  Normocephalic, atraumatic skull.  There are no ocular or oral lesions.  There are no palpable cervical or supraclavicular lymph nodes.  Lungs:  Clear bilaterally. Cardiac:  Regular rate and rhythm with a normal S1 and S2.  There are no murmurs, rubs, or bruits.  Abdomen:  Soft with good bowel sounds.  There is no palpable abdominal mass.  There is no fluid wave.  There is no palpable hepatosplenomegaly.  Back:  No tenderness over the spine, ribs, or hips.  Extremities:  No clubbing, cyanosis or edema.  LABORATORY STUDIES:  White cell count 2.6, hemoglobin 14, hematocrit 40.3, platelet count 297.  MCV is 89.  Peripheral smear shows good maturation of her white blood cells.  She has increase in lymphocytes.  Lymphocytes appear mature.  I do not see any hypersegmented polys.  There are no immature myeloid forms.  I do not see any blasts.  There are no atypical lymphocytes.  Red cells appear normochromic and normocytic.  She has no nucleated red blood cells.  There are no teardrop cells.  There is no rouleaux formation. Platelets are adequate in number and size.  IMPRESSION:  Ms. Durand is a 56 year old white female with chronic leukopenia.  I have been following her now for a year.  Her blood smear continues to look relatively benign.  Her blood counts  have been relatively stable.  It is hard to say what is triggering this.  She is taking Lotrel, Pacerone, Crestor, and Micardis.  These have very low instances of leukopenia.  Since she is asymptomatic, I do not see that we need to make any changes in our management.  I do not see that she needs to have a bone marrow test done.  I think we can probably get her back in 4 months now.  I think we can get her through the holidays and wintertime.    ______________________________ Josph Macho, M.D. PRE/MEDQ  D:  01/16/2012  T:  01/17/2012  Job:  [id number]

## 2012-03-04 ENCOUNTER — Other Ambulatory Visit (HOSPITAL_COMMUNITY): Payer: Self-pay | Admitting: Obstetrics and Gynecology

## 2012-03-04 DIAGNOSIS — Z1231 Encounter for screening mammogram for malignant neoplasm of breast: Secondary | ICD-10-CM

## 2012-04-15 ENCOUNTER — Telehealth: Payer: Self-pay | Admitting: Hematology & Oncology

## 2012-04-15 NOTE — Telephone Encounter (Signed)
Per MD to cx 05/15/12 apt and resch patient.  i spoke with patient and she resch for 05/27/12

## 2012-04-25 ENCOUNTER — Telehealth: Payer: Self-pay | Admitting: Hematology & Oncology

## 2012-04-25 NOTE — Telephone Encounter (Signed)
Patient called and cx 06/03/12 apt and resch for 06/12/12

## 2012-05-15 ENCOUNTER — Other Ambulatory Visit: Payer: 59 | Admitting: Lab

## 2012-05-15 ENCOUNTER — Ambulatory Visit: Payer: 59 | Admitting: Hematology & Oncology

## 2012-05-26 ENCOUNTER — Ambulatory Visit: Payer: 59 | Admitting: Hematology & Oncology

## 2012-05-26 ENCOUNTER — Other Ambulatory Visit: Payer: 59 | Admitting: Lab

## 2012-05-26 ENCOUNTER — Ambulatory Visit (HOSPITAL_COMMUNITY)
Admission: RE | Admit: 2012-05-26 | Discharge: 2012-05-26 | Disposition: A | Discharge status: Home / Self Care | Payer: 59 | Source: Ambulatory Visit | Attending: Obstetrics and Gynecology | Admitting: Obstetrics and Gynecology

## 2012-05-26 DIAGNOSIS — Z1231 Encounter for screening mammogram for malignant neoplasm of breast: Secondary | ICD-10-CM | POA: Insufficient documentation

## 2012-05-26 IMAGING — MG MM DIGITAL SCREENING BILAT
4 series · 4 of 4 positions shown · non-contrast
Comparison: [DATE]

CLINICAL DATA: Screening.

DIGITAL BILATERAL SCREENING MAMMOGRAM WITH CAD

[R CC]
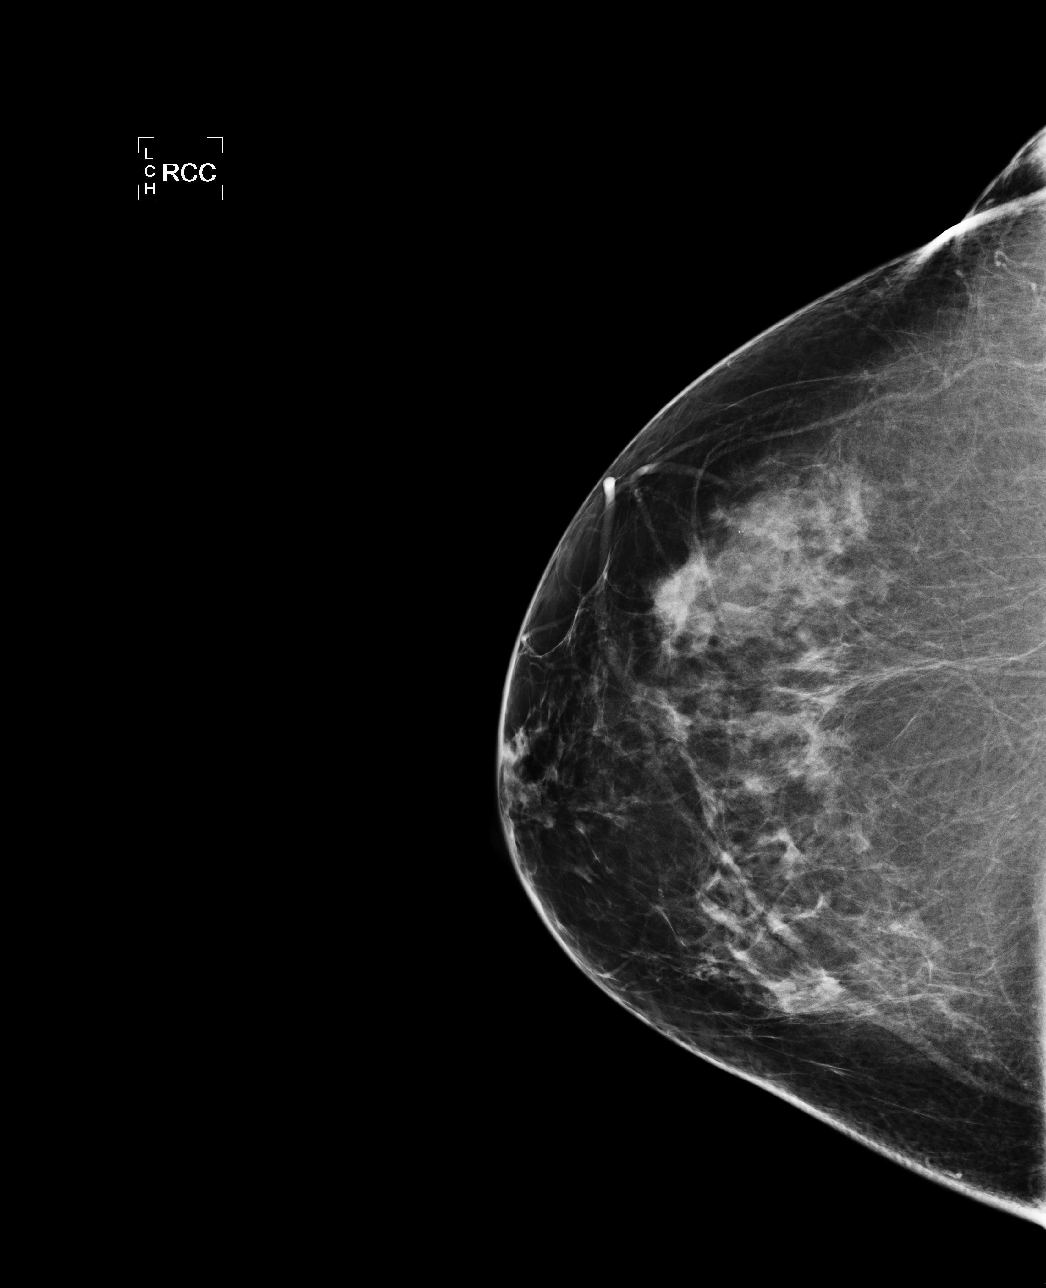

[R MLO]
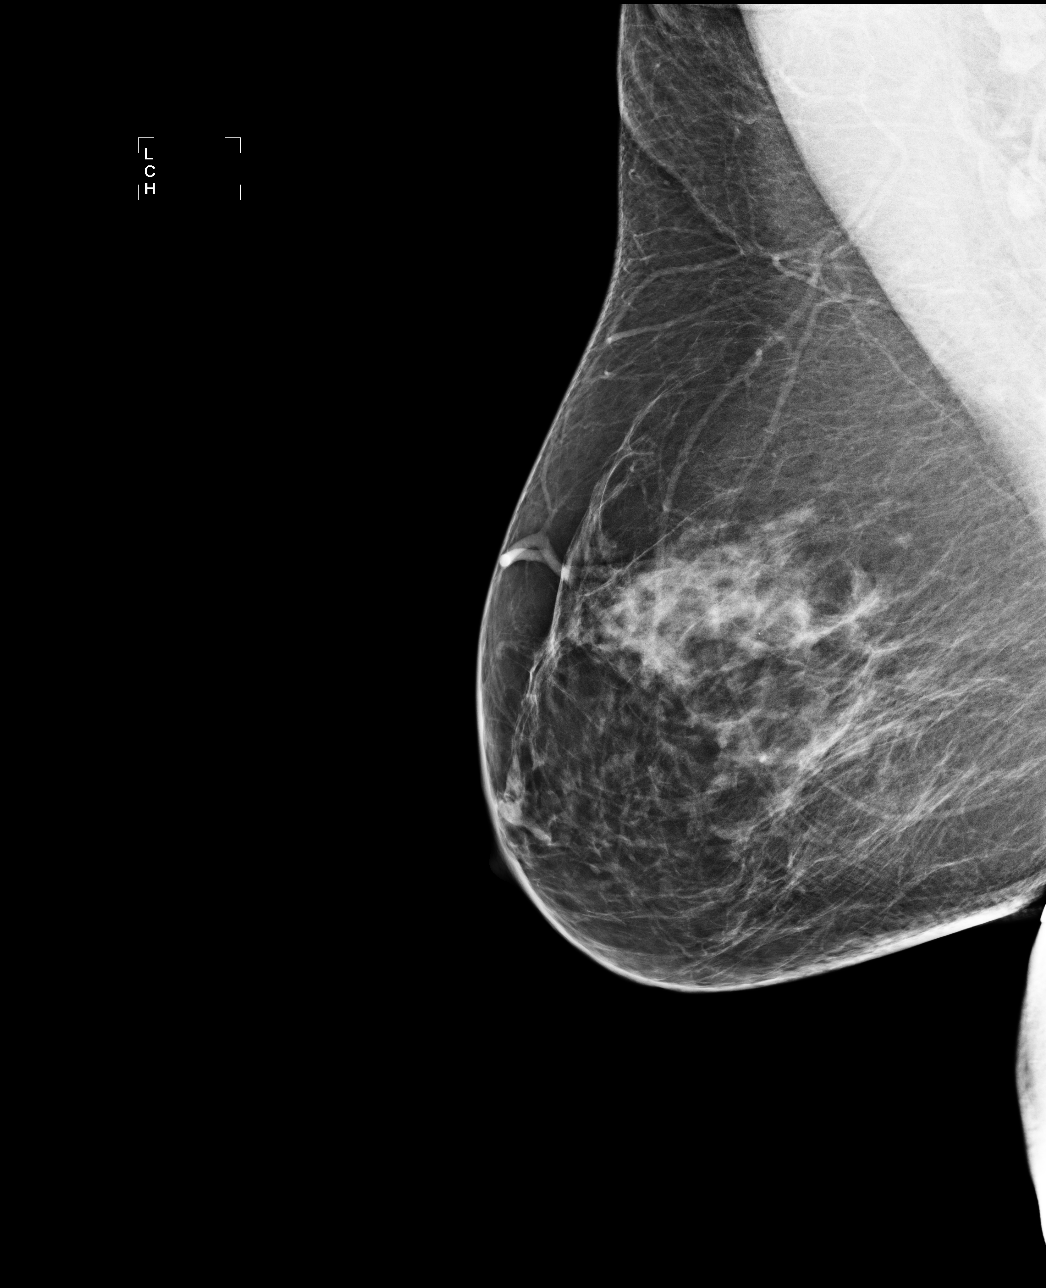

[L CC]
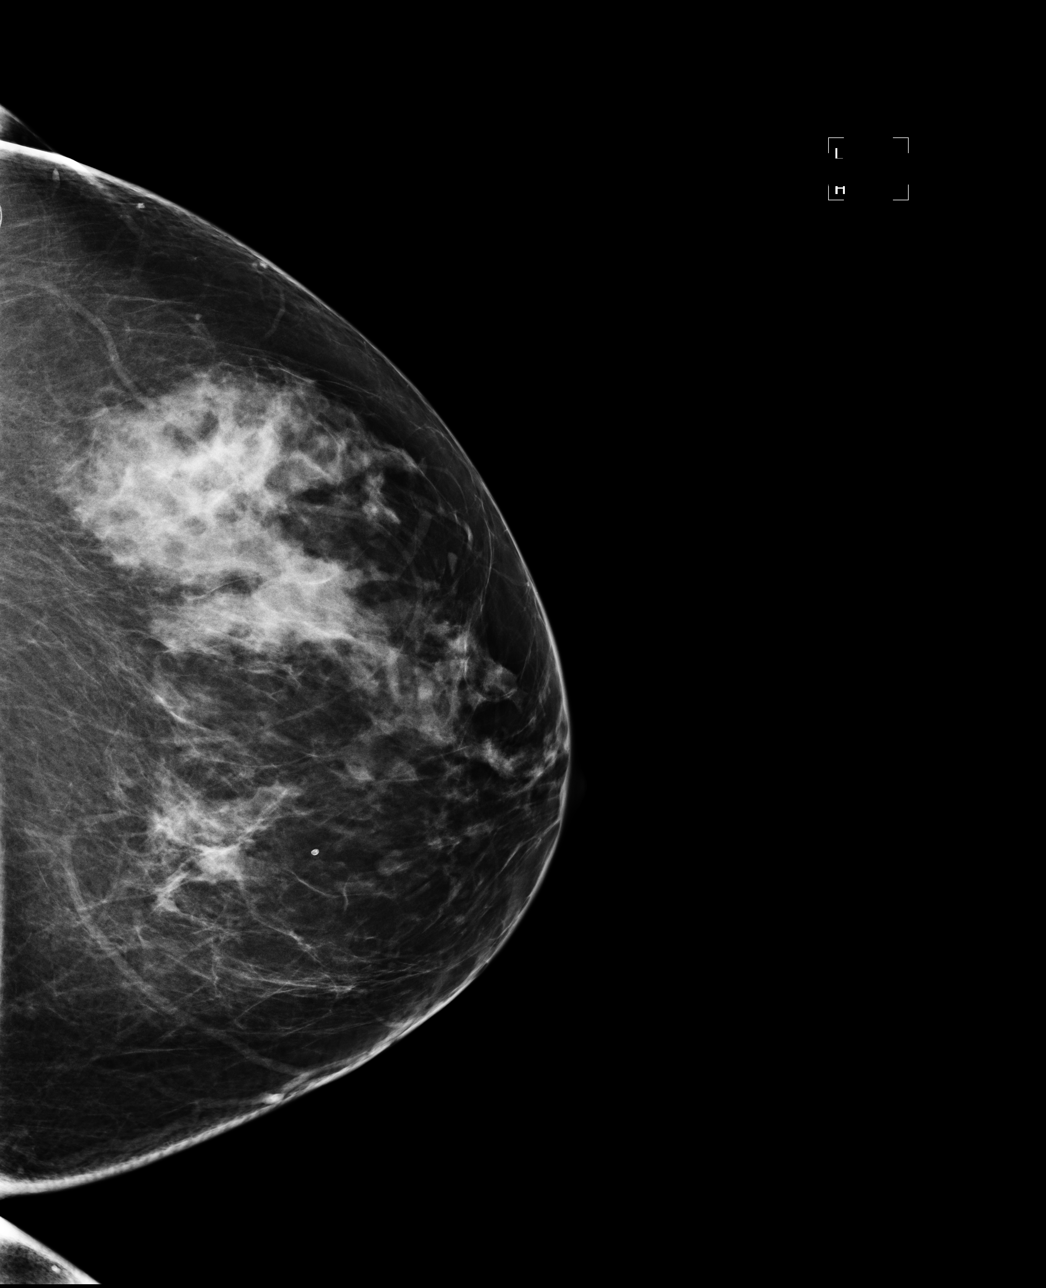

[L MLO]
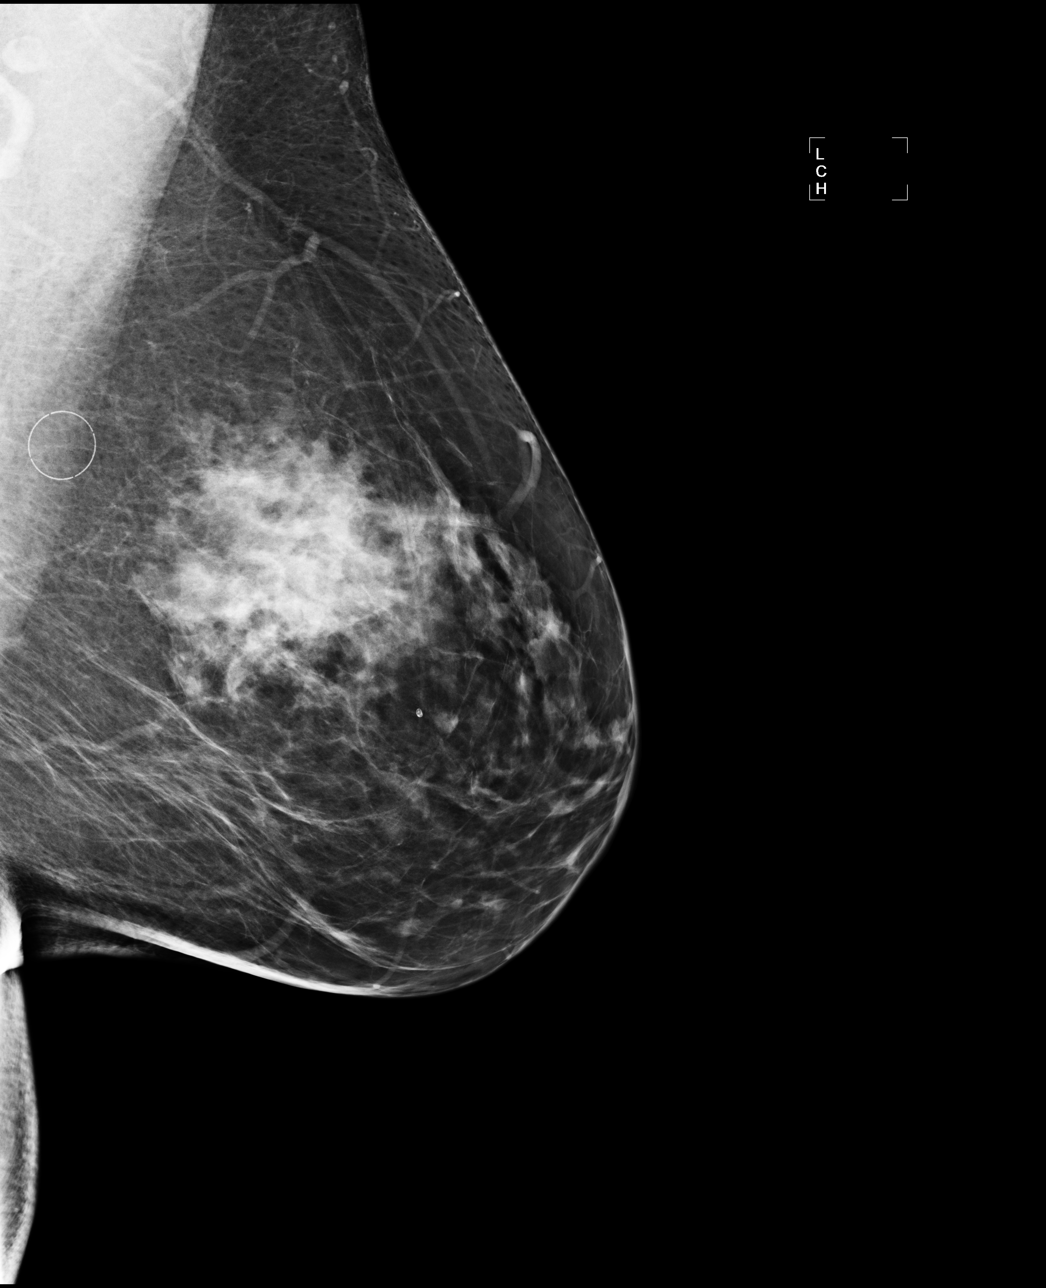

[4 of 4 positions shown; findings below may reference images not displayed]

FINDINGS: ACR Breast Density Category 3: The breast tissue is heterogeneously
dense.

There is no suspicious dominant mass, architectural distortion, or
calcification to suggest malignancy.

Images were processed with CAD.
IMPRESSION: No mammographic evidence of malignancy.

A result letter of this screening mammogram will be mailed directly
to the patient.

RECOMMENDATION:
Screening mammogram in one year. (Code:[G3])

BI-RADS CATEGORY 1:  Negative.

## 2012-05-27 ENCOUNTER — Ambulatory Visit: Payer: 59 | Admitting: Hematology & Oncology

## 2012-05-27 ENCOUNTER — Other Ambulatory Visit: Payer: 59 | Admitting: Lab

## 2012-06-03 ENCOUNTER — Other Ambulatory Visit: Payer: 59 | Admitting: Lab

## 2012-06-03 ENCOUNTER — Ambulatory Visit: Payer: 59 | Admitting: Hematology & Oncology

## 2012-06-12 ENCOUNTER — Ambulatory Visit (HOSPITAL_BASED_OUTPATIENT_CLINIC_OR_DEPARTMENT_OTHER): Payer: 59 | Admitting: Hematology & Oncology

## 2012-06-12 ENCOUNTER — Other Ambulatory Visit (HOSPITAL_BASED_OUTPATIENT_CLINIC_OR_DEPARTMENT_OTHER): Payer: 59 | Admitting: Lab

## 2012-06-12 VITALS — BP 116/66 | HR 78 | Temp 97.9°F | Resp 16 | Ht 64.0 in | Wt 194.0 lb

## 2012-06-12 DIAGNOSIS — D708 Other neutropenia: Secondary | ICD-10-CM

## 2012-06-12 DIAGNOSIS — D72819 Decreased white blood cell count, unspecified: Secondary | ICD-10-CM

## 2012-06-12 LAB — CBC WITH DIFFERENTIAL (CANCER CENTER ONLY)
BASO#: 0 10*3/uL (ref 0.0–0.2)
EOS%: 3.7 % (ref 0.0–7.0)
Eosinophils Absolute: 0.1 10*3/uL (ref 0.0–0.5)
HCT: 40.5 % (ref 34.8–46.6)
HGB: 13.9 g/dL (ref 11.6–15.9)
MCH: 30.8 pg (ref 26.0–34.0)
MCHC: 34.3 g/dL (ref 32.0–36.0)
MONO%: 25 % — ABNORMAL HIGH (ref 0.0–13.0)
NEUT#: 0.6 10*3/uL — ABNORMAL LOW (ref 1.5–6.5)
NEUT%: 18.3 % — ABNORMAL LOW (ref 39.6–80.0)
RBC: 4.51 10*6/uL (ref 3.70–5.32)

## 2012-06-12 NOTE — Progress Notes (Signed)
This office note has been dictated.

## 2012-06-13 NOTE — Progress Notes (Signed)
CC:   [name], MD  DIAGNOSIS:  Chronic leukopenia.  CURRENT THERAPY:  Observation.  INTERIM HISTORY:  Ms. Santini comes in for her followup.  She is under a lot of stress.  Her mother-in-law fell and broke multiple bones.  She is trying to help take care of her, trying to deal with work, and trying to help with all of the animals that they have.  We will certainly pray hard for her.  She has had no problems with fevers, sweats, or chills.  She got through the wintertime okay.  She has had no palpable lymph nodes.  She has had no abdominal pain. There has been no early satiety.  She has had no change in bowel or bladder habits.  There have been no rashes.  PHYSICAL EXAMINATION:  General:  This is a well-developed, well- nourished white female in no obvious distress.  Vital signs: Temperature of 97.9, pulse 78, respiratory rate 16, blood pressure 116/66.  Weight is 194.  Head and neck:  Normocephalic, atraumatic skull.  There are no ocular or oral lesions.  There are no palpable cervical or supraclavicular lymph nodes.  Lungs:  Clear bilaterally. Cardiac:  Regular rate and rhythm with a normal S1 and S2.  There are no murmurs, rubs, or bruits.  Abdomen:  Soft with good bowel sounds.  There is no palpable abdominal mass.  There is no fluid wave.  There is no palpable hepatosplenomegaly.  Back:  No tenderness over the spine, ribs, or hips.  Extremities:  No clubbing, cyanosis, or edema.  Neurological: No focal neurological deficits.  Skin:  No rashes, ecchymosis, or petechia.  LABORATORY STUDIES:  White cell count is 3, hemoglobin 13.9, hematocrit 40.5, platelet count 255.  MCV is 90.  White cell differential shows 18 segs 52 lymphs, 25 monos.  Peripheral smear shows good maturation of her white blood cels.  She does have an increase in lymphocytes.  I do not see any immature lymphocytes.  She has a couple of large lymphocytes.  She has an increase in monocytes that appear  mature.  I do not see any blasts.  Red cells show no nucleated red blood cells.  I see no teardrop cells. There is no rouleaux formation.  Platelets are adequate in number and size.  IMPRESSION:  Ms. Amey is a very nice 57 year old white female with leukopenia.  We have been following her now for about a year and a half. Her white cell count really has not trended downward any.  Her peripheral blood smear is reassuring in that I do not see any obvious myelodysplastic or other hematologic issues with respect to the bone marrow.  I still do not think a bone marrow biopsy needs to be done.  She is asymptomatic.  Again, I feel reassured by the blood smear.  We will continue to see her back in 4 months.  We will certainly pray hard for her and her family situation.    ______________________________ Josph Macho, M.D. PRE/MEDQ  D:  06/12/2012  T:  06/13/2012  Job:  [id number]

## 2012-10-16 ENCOUNTER — Telehealth: Payer: Self-pay | Admitting: Hematology & Oncology

## 2012-10-16 ENCOUNTER — Other Ambulatory Visit (HOSPITAL_BASED_OUTPATIENT_CLINIC_OR_DEPARTMENT_OTHER): Payer: 59 | Admitting: Lab

## 2012-10-16 ENCOUNTER — Ambulatory Visit (HOSPITAL_BASED_OUTPATIENT_CLINIC_OR_DEPARTMENT_OTHER): Payer: 59 | Admitting: Hematology & Oncology

## 2012-10-16 VITALS — BP 110/70 | HR 74 | Temp 97.9°F | Resp 16 | Ht 64.0 in | Wt 190.0 lb

## 2012-10-16 DIAGNOSIS — D709 Neutropenia, unspecified: Secondary | ICD-10-CM

## 2012-10-16 DIAGNOSIS — D72819 Decreased white blood cell count, unspecified: Secondary | ICD-10-CM

## 2012-10-16 LAB — CBC WITH DIFFERENTIAL (CANCER CENTER ONLY)
Eosinophils Absolute: 0.1 10*3/uL (ref 0.0–0.5)
HCT: 40.3 % (ref 34.8–46.6)
LYMPH%: 59.6 % — ABNORMAL HIGH (ref 14.0–48.0)
MCH: 31.1 pg (ref 26.0–34.0)
MCV: 91 fL (ref 81–101)
MONO#: 0.6 10*3/uL (ref 0.1–0.9)
MONO%: 27 % — ABNORMAL HIGH (ref 0.0–13.0)
NEUT%: 7.3 % — ABNORMAL LOW (ref 39.6–80.0)
Platelets: 290 10*3/uL (ref 145–400)
RBC: 4.44 10*6/uL (ref 3.70–5.32)
RDW: 11.9 % (ref 11.1–15.7)
WBC: 2.3 10*3/uL — ABNORMAL LOW (ref 3.9–10.0)

## 2012-10-16 NOTE — Telephone Encounter (Signed)
Pt aware scheduling will call her for BMBX appointment. I spoke with Sheralyn Boatman

## 2012-10-16 NOTE — Progress Notes (Signed)
This office note has been dictated.

## 2012-10-17 NOTE — Progress Notes (Signed)
CC:   [name], MD  DIAGNOSIS:  Chronic leukopenia.  CURRENT THERAPY:  Observation.  INTERIM HISTORY:  Ms. Marczak comes in for followup.  We last saw her back in April.  Since then, she has been doing okay.  She is still working.  She is working for [employer].  She is doing okay with this.  She is staying quite busy and this is somewhat stressful.  She has had no problems with rashes.  She has had no infections.  She has had no problems with bowel or bladder habits.  She has lost a little bit of weight.  She is exercising more.  She has had no fatigue.  She is sleeping okay.  She has had no change in her medications.  PHYSICAL EXAMINATION:  General:  This is a well-developed, well- nourished white female in no obvious distress.  Vital signs: Temperature of 97.9, pulse 74, respiratory rate 16, blood pressure 110/70.  Weight is 190 pounds.  Head and neck:  Normocephalic, atraumatic skull.  There are no ocular or oral lesions.  There are no palpable cervical or supraclavicular lymph nodes.  Lungs:  Clear bilaterally.  Cardiac:  Regular rate and rhythm with a normal S1, S2. There are no murmurs, rubs or bruits.  Abdomen:  Soft.  She has good bowel sounds.  There is no fluid wave.  There is no palpable hepatosplenomegaly.  Extremities:  Show no clubbing, cyanosis or edema. Skin:  No rashes, ecchymosis, or petechia.  LABORATORY STUDIES:  White cell count is 2.3, hemoglobin 13.8, hematocrit 40.3, platelet count 290.  MCV is 91.  White cell differential shows 7% neutrophils, 60% lymphocytes, 27% monocytes.  Peripheral smear shows a normochromic, normocytic population of red blood cells.  There are no nucleated red blood cells.  There are no teardrop cells.  She has no rouleaux formation.  White cells show decreased in number.  She has markedly decreased neutrophils.  She has an increase in lymphocytes or monocytes.  I do not see any immature myeloid cells.  There are no hypersegmented  polys.  There are no atypical lymphocytes.  There are no blasts.  Platelets are normal.  IMPRESSION:  Ms. Morici is a 57 year old white female with chronic leukopenia.  Unfortunately, I think we are going to have to do a bone marrow test on her.  I just do not like how her white cell count is trending.  She has markedly decreased neutrophils with this visit.  Even though I did not see anything unusual with her blood smear, I still think we need to figure out what is going on.  I still have to believe that she has some kind autoimmune neutropenia. If so, we can certainly consider utilizing Neupogen if necessary.  Again, we have to keep in mind that she never has had any issues with infections or other complications.  We will go ahead and plan to have the bone marrow test done by Radiology after Labor Day as she will be going to [facility] to see her son who is on an aircraft carrier.  I will see her back about 2 weeks after the bone marrow test is done.    ______________________________ Josph Macho, M.D. PRE/MEDQ  D:  10/16/2012  T:  10/17/2012  Job:  [id number]

## 2012-10-30 ENCOUNTER — Encounter (HOSPITAL_COMMUNITY): Payer: Self-pay | Admitting: Pharmacy Technician

## 2012-10-30 ENCOUNTER — Other Ambulatory Visit: Payer: Self-pay | Admitting: Radiology

## 2012-11-03 ENCOUNTER — Encounter (HOSPITAL_COMMUNITY): Payer: Self-pay

## 2012-11-03 ENCOUNTER — Ambulatory Visit (HOSPITAL_COMMUNITY)
Admission: RE | Admit: 2012-11-03 | Discharge: 2012-11-03 | Disposition: A | Discharge status: Home / Self Care | Payer: 59 | Source: Ambulatory Visit | Attending: Hematology & Oncology | Admitting: Hematology & Oncology

## 2012-11-03 DIAGNOSIS — I10 Essential (primary) hypertension: Secondary | ICD-10-CM | POA: Insufficient documentation

## 2012-11-03 DIAGNOSIS — E785 Hyperlipidemia, unspecified: Secondary | ICD-10-CM | POA: Insufficient documentation

## 2012-11-03 DIAGNOSIS — Z79899 Other long term (current) drug therapy: Secondary | ICD-10-CM | POA: Insufficient documentation

## 2012-11-03 DIAGNOSIS — D72819 Decreased white blood cell count, unspecified: Secondary | ICD-10-CM

## 2012-11-03 DIAGNOSIS — D709 Neutropenia, unspecified: Secondary | ICD-10-CM | POA: Insufficient documentation

## 2012-11-03 LAB — CBC
Hemoglobin: 13.2 g/dL (ref 12.0–15.0)
MCH: 30.6 pg (ref 26.0–34.0)
MCHC: 34.4 g/dL (ref 30.0–36.0)
MCV: 89.1 fL (ref 78.0–100.0)
RBC: 4.31 MIL/uL (ref 3.87–5.11)

## 2012-11-03 MED ORDER — MIDAZOLAM HCL 2 MG/2ML IJ SOLN
INTRAMUSCULAR | Status: AC
  Filled 2012-11-03: qty 6

## 2012-11-03 MED ORDER — SODIUM CHLORIDE 0.9 % IV SOLN
INTRAVENOUS | Status: DC
  Administered 2012-11-03: 07:00:00 via INTRAVENOUS

## 2012-11-03 MED ORDER — MIDAZOLAM HCL 2 MG/2ML IJ SOLN
INTRAMUSCULAR | Status: AC | PRN
  Administered 2012-11-03 (×2): 1 mg via INTRAVENOUS

## 2012-11-03 MED ORDER — FENTANYL CITRATE 0.05 MG/ML IJ SOLN
INTRAMUSCULAR | Status: AC | PRN
  Administered 2012-11-03: 100 ug via INTRAVENOUS

## 2012-11-03 MED ORDER — FENTANYL CITRATE 0.05 MG/ML IJ SOLN
INTRAMUSCULAR | Status: AC
  Filled 2012-11-03: qty 6

## 2012-11-03 NOTE — H&P (Signed)
Agree 

## 2012-11-03 NOTE — H&P (Signed)
Jasmine Castro is an 57 y.o. female.   Chief Complaint: "I'm having a bone marrow biopsy" HPI: Patient with history of chronic leukopenia presents today for CT guided bone marrow biopsy.  Past Medical History  Diagnosis Date  . Leukopenia 01/31/2011  . Anemia   . Hyperlipidemia   . Hypertension     Past Surgical History  Procedure Laterality Date  . Tubal ligation  1993  . Tonsillectomy      at age 44    Family History  Problem Relation Age of Onset  . Cancer Mother     breast  . Heart disease Maternal Aunt   . Heart disease Maternal Uncle   . Heart disease Maternal Grandmother   . Heart disease Maternal Grandfather    Social History:  reports that she has never smoked. She has never used smokeless tobacco. She reports that she does not drink alcohol or use illicit drugs.  Allergies:  Allergies  Allergen Reactions  . Sulfonamide Derivatives     REACTION: swelling    Current outpatient prescriptions:amlodipine-benazepril (LOTREL) 2.5-10 MG per capsule, Take 1 capsule by mouth every morning. , Disp: , Rfl: ;  Ascorbic Acid (VITAMIN C) 1000 MG tablet, Take 1,000 mg by mouth daily., Disp: , Rfl: ;  CALCIUM PO, Take 600 mg by mouth daily. , Disp: , Rfl: ;  cholecalciferol (VITAMIN D) 1000 UNITS tablet, Take 1,000 Units by mouth daily., Disp: , Rfl:  fish oil-omega-3 fatty acids 1000 MG capsule, Take 2 g by mouth daily., Disp: , Rfl: ;  Multiple Vitamin (MULTIVITAMIN) capsule, Take 1 capsule by mouth daily., Disp: , Rfl: ;  rosuvastatin (CRESTOR) 5 MG tablet, Take 5 mg by mouth every morning., Disp: , Rfl: ;  telmisartan (MICARDIS) 80 MG tablet, Take 80 mg by mouth every morning. , Disp: , Rfl: ;  VITAMIN E PO, Take 400 mg by mouth daily. , Disp: , Rfl:  Current facility-administered medications:0.9 %  sodium chloride infusion, , Intravenous, Continuous, D Kevin Donelle Hise, PA-C, Last Rate: 20 mL/hr at 11/03/12 0725;  fentaNYL (SUBLIMAZE) 0.05 MG/ML injection, , , , ;  midazolam (VERSED) 2  MG/2ML injection, , , ,    Results for orders placed during the hospital encounter of 11/03/12 (from the past 48 hour(s))  APTT     Status: None   Collection Time    11/03/12  7:20 AM      Result Value Range   aPTT 27  24 - 37 seconds  CBC     Status: Abnormal   Collection Time    11/03/12  7:20 AM      Result Value Range   WBC 2.2 (*) 4.0 - 10.5 K/uL   RBC 4.31  3.87 - 5.11 MIL/uL   Hemoglobin 13.2  12.0 - 15.0 g/dL   HCT 16.1  09.6 - 04.5 %   MCV 89.1  78.0 - 100.0 fL   MCH 30.6  26.0 - 34.0 pg   MCHC 34.4  30.0 - 36.0 g/dL   RDW 40.9  81.1 - 91.4 %   Platelets 283  150 - 400 K/uL  PROTIME-INR     Status: None   Collection Time    11/03/12  7:20 AM      Result Value Range   Prothrombin Time 12.2  11.6 - 15.2 seconds   INR 0.92  0.00 - 1.49   No results found.  Review of Systems  Constitutional: Negative for fever and chills.  Respiratory: Negative for  cough and shortness of breath.   Cardiovascular: Negative for chest pain.  Gastrointestinal: Negative for nausea, vomiting and abdominal pain.  Musculoskeletal: Negative for back pain.  Neurological: Negative for headaches.  Endo/Heme/Allergies: Does not bruise/bleed easily.    Blood pressure 132/73, pulse 73, temperature 97.7 F (36.5 C), temperature source Oral, resp. rate 20, height 5\' 4"  (1.626 m), weight 188 lb (85.276 kg), SpO2 9.00%.  (O2 SATS 99% RA) Physical Exam  Constitutional: She is oriented to person, place, and time. She appears well-developed and well-nourished.  Cardiovascular: Normal rate and regular rhythm.   Respiratory: Effort normal and breath sounds normal.  GI: Soft. Bowel sounds are normal. There is no tenderness.  Musculoskeletal: Normal range of motion. She exhibits no edema.  Neurological: She is alert and oriented to person, place, and time.     Assessment/Plan Pt with hx of chronic leukopenia. Plan is for CT guided bone marrow biopsy today. Details/risks of procedure d/w pt/family with  their understanding and consent.  Jasmine Castro,D KEVIN 11/03/2012, 8:57 AM

## 2012-11-03 NOTE — Procedures (Signed)
Procedure:  CT guided bone marrow biopsy Findings:  Right iliac BM aspirate and core biopsy performed.  No complications.

## 2012-11-05 ENCOUNTER — Ambulatory Visit (HOSPITAL_COMMUNITY): Payer: 59

## 2012-11-05 ENCOUNTER — Other Ambulatory Visit (HOSPITAL_COMMUNITY): Payer: 59

## 2012-11-11 LAB — CHROMOSOME ANALYSIS, BONE MARROW

## 2012-11-27 ENCOUNTER — Encounter: Payer: Self-pay | Admitting: Hematology & Oncology

## 2012-11-28 ENCOUNTER — Other Ambulatory Visit (HOSPITAL_BASED_OUTPATIENT_CLINIC_OR_DEPARTMENT_OTHER): Payer: 59 | Admitting: Lab

## 2012-11-28 ENCOUNTER — Ambulatory Visit (HOSPITAL_BASED_OUTPATIENT_CLINIC_OR_DEPARTMENT_OTHER): Payer: 59 | Admitting: Hematology & Oncology

## 2012-11-28 VITALS — BP 134/89 | HR 74 | Temp 98.1°F | Resp 14 | Ht 64.0 in | Wt 188.0 lb

## 2012-11-28 DIAGNOSIS — D72819 Decreased white blood cell count, unspecified: Secondary | ICD-10-CM

## 2012-11-28 DIAGNOSIS — D709 Neutropenia, unspecified: Secondary | ICD-10-CM

## 2012-11-28 LAB — CBC WITH DIFFERENTIAL (CANCER CENTER ONLY)
BASO#: 0 10*3/uL (ref 0.0–0.2)
EOS%: 3.6 % (ref 0.0–7.0)
MCH: 31 pg (ref 26.0–34.0)
MCHC: 34.1 g/dL (ref 32.0–36.0)
MONO%: 24.5 % — ABNORMAL HIGH (ref 0.0–13.0)
NEUT#: 0.3 10*3/uL — CL (ref 1.5–6.5)
Platelets: 300 10*3/uL (ref 145–400)
RBC: 4.35 10*6/uL (ref 3.70–5.32)
WBC: 2.5 10*3/uL — ABNORMAL LOW (ref 3.9–10.0)

## 2012-11-28 NOTE — Progress Notes (Signed)
This office note has been dictated.

## 2012-11-28 NOTE — Progress Notes (Signed)
CC:   [name], MD  DIAGNOSIS:  Autoimmune neutropenia.  CURRENT THERAPY:  Observation.  INTERIM HISTORY:  Ms. Cutillo comes in for followup.  We did go ahead and do a bone marrow test on her.  This was done by Radiology on September 8th.  The bone marrow report __________([id number]) showed that she had a normocellular marrow.  She had normal trilineage hematopoiesis.  She had abundant neutrophils in the marrow.  There were no blasts.  Flow cytometry was negative.  Cytogenetics were also negative.  As such, I believe that she qualifies for autoimmune neutropenia.  So far, we have not found any etiology for the neutropenia. She feels well.  She has never had any problem with infections.  She has had no abdominal pain.  She is losing some weight by exercising.  She is happy about this.  Of note, we have checked her for collagen vascular disease.  She is ANA negative and rheumatoid factor negative.  She has also been checked for Wilson's disease.  Her ceruloplasmin was normal at 32.  She did have an ultrasound done.  This was about 4 years ago.  The ultrasound showed no splenomegaly.  The liver looked okay.  She has had no problems with bleeding or bruising.  She has had no rashes.  She has had no fevers, sweats, or chills.  There has been no change in bowel or bladder habits.  PHYSICAL EXAMINATION:  General:  This is a well-developed, well- nourished white female in no obvious distress.  Vital signs:  Show a temperature of 98.1, pulse 74, respiratory rate 14, blood pressure 134/89.  Weight is 188 pounds.  Head and neck:  Shows a normocephalic, atraumatic skull.  There are no ocular or oral lesions.  There are no palpable cervical or supraclavicular lymph nodes.  Lungs:  Clear to percussion and auscultation bilaterally.  Cardiac:  Regular rate and rhythm, with a normal S1 and S2.  There are no murmurs, rubs or bruits. Abdomen:  Soft.  She has good bowel sounds.  There is no  fluid wave. There is no palpable abdominal mass.  There is no palpable hepatosplenomegaly.  Extremities:  Show no clubbing, cyanosis or edema. Neurological:  Shows no focal neurological deficits.  Skin:  No rashes, ecchymoses, or petechia.  LABORATORY STUDIES:  White cell count is 2.5, hemoglobin 13.5, hematocrit 39.6, platelet count 300,000.  White cell differential shows 10% segs, 61 lymphs, 24 monos.  Peripheral smear shows good maturation of her white blood cells.  She has an increase in lymphocytes and monocytes.  Again, they appear mature.  I do not see any hypersegmented polys.  There are no nucleated red blood cells.  There is no rouleaux formation.  There are no schistocytes or target cells.  I see no inclusion bodies.  Platelets are adequate in number and size.  IMPRESSION:  Ms. Semel is a 57 year old white female with neutropenia. Again, one would have to suspect that this is autoimmune neutropenia. Her bone marrow is normal.  She has excellent bone marrow cellularity.  For now, I think we can just follow her.  I do not see an indication for steroids.  I do not see an indication for Neupogen.  I told her that she needs to call us if she has any temperatures.  I will plan to see her back in another 3 or 4 months.  I spent a good half or so with her today.    ______________________________ Josph Macho,  M.D. PRE/MEDQ  D:  11/28/2012  T:  11/28/2012  Job:  [id number]

## 2013-01-01 ENCOUNTER — Other Ambulatory Visit: Payer: Self-pay

## 2013-02-24 ENCOUNTER — Encounter (HOSPITAL_COMMUNITY): Payer: Self-pay

## 2013-03-16 ENCOUNTER — Other Ambulatory Visit (HOSPITAL_COMMUNITY): Payer: Self-pay | Admitting: Obstetrics and Gynecology

## 2013-03-16 DIAGNOSIS — Z1231 Encounter for screening mammogram for malignant neoplasm of breast: Secondary | ICD-10-CM

## 2013-04-02 ENCOUNTER — Other Ambulatory Visit (HOSPITAL_BASED_OUTPATIENT_CLINIC_OR_DEPARTMENT_OTHER): Payer: 59 | Admitting: Lab

## 2013-04-02 ENCOUNTER — Ambulatory Visit (HOSPITAL_BASED_OUTPATIENT_CLINIC_OR_DEPARTMENT_OTHER): Payer: 59 | Admitting: Hematology & Oncology

## 2013-04-02 ENCOUNTER — Encounter: Payer: Self-pay | Admitting: Hematology & Oncology

## 2013-04-02 VITALS — BP 133/66 | HR 77 | Temp 98.0°F | Resp 14 | Ht 64.0 in | Wt 196.0 lb

## 2013-04-02 DIAGNOSIS — D72819 Decreased white blood cell count, unspecified: Secondary | ICD-10-CM

## 2013-04-02 DIAGNOSIS — D709 Neutropenia, unspecified: Secondary | ICD-10-CM

## 2013-04-02 LAB — CBC WITH DIFFERENTIAL (CANCER CENTER ONLY)
BASO#: 0 10*3/uL (ref 0.0–0.2)
BASO%: 0.4 % (ref 0.0–2.0)
EOS ABS: 0.1 10*3/uL (ref 0.0–0.5)
EOS%: 4 % (ref 0.0–7.0)
HEMATOCRIT: 38.6 % (ref 34.8–46.6)
HEMOGLOBIN: 13.2 g/dL (ref 11.6–15.9)
LYMPH#: 1.2 10*3/uL (ref 0.9–3.3)
LYMPH%: 52 % — ABNORMAL HIGH (ref 14.0–48.0)
MCH: 30.5 pg (ref 26.0–34.0)
MCHC: 34.2 g/dL (ref 32.0–36.0)
MCV: 89 fL (ref 81–101)
MONO#: 0.5 10*3/uL (ref 0.1–0.9)
MONO%: 23.1 % — ABNORMAL HIGH (ref 0.0–13.0)
NEUT%: 20.5 % — AB (ref 39.6–80.0)
NEUTROS ABS: 0.5 10*3/uL — AB (ref 1.5–6.5)
Platelets: 253 10*3/uL (ref 145–400)
RBC: 4.33 10*6/uL (ref 3.70–5.32)
RDW: 12 % (ref 11.1–15.7)
WBC: 2.3 10*3/uL — ABNORMAL LOW (ref 3.9–10.0)

## 2013-04-02 LAB — CHCC SATELLITE - SMEAR

## 2013-04-02 NOTE — Progress Notes (Signed)
This office note has been dictated.

## 2013-04-04 NOTE — Progress Notes (Signed)
CC:   Precious Reel, MD  DIAGNOSIS:  Autoimmune neutropenia.  CURRENT THERAPY:  Observation.  INTERIM HISTORY:  Ms. Burpee comes in for a followup.  We last saw her back in October.  She is going through a little bit of tough time right now.  She is having a lot of stress at work.  This has really starting to affect her.  She works for [employer].  This is like going on that she is having tough time trying to get through.  There is less staff __________ more work for her.  She, otherwise, has been doing okay health wise.  She has had no problem with infections.  There has been no arthritic issues.  She has had no abdominal pain.  There has been no change in bowel or bladder habits. She has had no rashes.  She has had no fever, sweats, or chills.  Of note, we did do a bone marrow test on her.  This was done back in September.  PHYSICAL EXAMINATION:  General:  This is a well-developed, well- nourished white female, in no obvious distress.  Vital Signs: Temperature of 98, pulse 77, respiratory rate 14, blood pressure 133/66. Weight is 196 pounds.  Head and Neck:  Shows no ocular or oral lesions. She has no palpable cervical or supraclavicular lymph nodes.  Lungs: Clear bilaterally.  Cardiac:  Regular rate and rhythm with a normal S1, S2.  There are no murmurs, rubs, or bruits.  Abdomen:  Soft.  She has good bowel sounds.  There is no fluid wave.  There is no palpable hepatosplenomegaly.  Extremities:  Shows no clubbing, cyanosis, or edema.  She has good range motion of her joints.  Skin:  No rashes, ecchymoses, or petechiae.  Neurologic:  No focal neurological deficits.  LABORATORY STUDIES:  White cell count is 2.3, hemoglobin 13.2, hematocrit 38.6, platelet count 253.  MCV is 89.  White cell differential shows 20 segs, 52 lymphocytes, 23 monos.  Peripheral smear shows improved number of neutrophils.  The myeloid cells are premature.  I see no hypersegmented polys.  There are no immature  myeloid cells.  Lymphocytes are still increased in number, but mature.  There are no nucleated red blood cells.  Overall, the white cell in total appears to be decreased.  Platelets are adequate in maturation.  IMPRESSION:  Jasmine Castro is a 58 year old white female.  She has autoimmune neutropenia.  This clearly is getting better right now.  Her neutrophils are coming up.  I am not sure as to why this is.  They do tend to go up and down.  The bone marrow biopsy is very reassuring to me.  I think we can probably get her back in six months now.  Her white cell count, I think, would trend within a range that should not cause her any problems.  Her bone marrow was very reassuring to me.  I will certainly pray for Ms. Reith.  I spent a good 25 to 30 minutes with her today.    ______________________________ Volanda Napoleon, M.D. PRE/MEDQ  D:  04/02/2013  T:  04/03/2013  Job:  [id number]

## 2013-05-28 ENCOUNTER — Ambulatory Visit (HOSPITAL_COMMUNITY)
Admission: RE | Admit: 2013-05-28 | Discharge: 2013-05-28 | Disposition: A | Discharge status: Home / Self Care | Payer: 59 | Source: Ambulatory Visit | Attending: Obstetrics and Gynecology | Admitting: Obstetrics and Gynecology

## 2013-05-28 DIAGNOSIS — Z1231 Encounter for screening mammogram for malignant neoplasm of breast: Secondary | ICD-10-CM | POA: Insufficient documentation

## 2013-09-29 ENCOUNTER — Ambulatory Visit (HOSPITAL_BASED_OUTPATIENT_CLINIC_OR_DEPARTMENT_OTHER): Payer: 59 | Admitting: Hematology & Oncology

## 2013-09-29 ENCOUNTER — Other Ambulatory Visit (HOSPITAL_BASED_OUTPATIENT_CLINIC_OR_DEPARTMENT_OTHER): Payer: 59 | Admitting: Lab

## 2013-09-29 VITALS — BP 124/0 | HR 69 | Temp 98.4°F | Wt 195.0 lb

## 2013-09-29 DIAGNOSIS — D72819 Decreased white blood cell count, unspecified: Secondary | ICD-10-CM

## 2013-09-29 DIAGNOSIS — D708 Other neutropenia: Secondary | ICD-10-CM

## 2013-09-29 LAB — CBC WITH DIFFERENTIAL (CANCER CENTER ONLY)
BASO#: 0 10*3/uL (ref 0.0–0.2)
BASO%: 0.5 % (ref 0.0–2.0)
EOS ABS: 0.1 10*3/uL (ref 0.0–0.5)
EOS%: 3.8 % (ref 0.0–7.0)
HEMATOCRIT: 39.7 % (ref 34.8–46.6)
HEMOGLOBIN: 13.6 g/dL (ref 11.6–15.9)
LYMPH#: 1.2 10*3/uL (ref 0.9–3.3)
LYMPH%: 57.1 % — AB (ref 14.0–48.0)
MCH: 31.1 pg (ref 26.0–34.0)
MCHC: 34.3 g/dL (ref 32.0–36.0)
MCV: 91 fL (ref 81–101)
MONO#: 0.4 10*3/uL (ref 0.1–0.9)
MONO%: 20.8 % — ABNORMAL HIGH (ref 0.0–13.0)
NEUT#: 0.4 10*3/uL — CL (ref 1.5–6.5)
NEUT%: 17.8 % — AB (ref 39.6–80.0)
Platelets: 292 10*3/uL (ref 145–400)
RBC: 4.38 10*6/uL (ref 3.70–5.32)
RDW: 12.1 % (ref 11.1–15.7)
WBC: 2.1 10*3/uL — AB (ref 3.9–10.0)

## 2013-09-29 LAB — CHCC SATELLITE - SMEAR

## 2013-09-29 NOTE — Progress Notes (Signed)
Hematology and Oncology Follow Up Visit  Jasmine Castro [id number] 1955-04-20 58 y.o. 09/29/2013   Principle Diagnosis:   Autoimmune neutropenia  Current Therapy:    Observation     Interim History:  Ms.  Castro is back for followup. Last saw her back in January. She's been doing well. She's working without difficulties. There she had no problems with fever, infections, or the flu since we saw her.  This been no rashes. She's had no bleeding. She's had no change in bowel or bladder habits. That has been no cough. She's had no leg swelling. There's been no change in medications.  Overall, her performance status is ECOG 0.  Medications: Current outpatient prescriptions:amlodipine-benazepril (LOTREL) 2.5-10 MG per capsule, Take 1 capsule by mouth every morning. , Disp: , Rfl: ;  Ascorbic Acid (VITAMIN C) 1000 MG tablet, Take 1,000 mg by mouth daily., Disp: , Rfl: ;  CALCIUM PO, Take 600 mg by mouth daily. , Disp: , Rfl: ;  cholecalciferol (VITAMIN D) 1000 UNITS tablet, Take 1,000 Units by mouth daily., Disp: , Rfl:  fish oil-omega-3 fatty acids 1000 MG capsule, Take 2 g by mouth daily., Disp: , Rfl: ;  Multiple Vitamin (MULTIVITAMIN) capsule, Take 1 capsule by mouth daily., Disp: , Rfl: ;  rosuvastatin (CRESTOR) 5 MG tablet, Take 5 mg by mouth every morning., Disp: , Rfl: ;  telmisartan (MICARDIS) 80 MG tablet, Take 80 mg by mouth every morning. , Disp: , Rfl: ;  VITAMIN E PO, Take 400 mg by mouth daily. , Disp: , Rfl:   Allergies:  Allergies  Allergen Reactions  . Sulfonamide Derivatives     REACTION: swelling    Past Medical History, Surgical history, Social history, and Family History were reviewed and updated.  Review of Systems: As above  Physical Exam:  vitals were not taken for this visit.  Well-developed and well-nourished white female in no obvious distress. Vital signs are temperature of 98.4. Pulse 69. Blood pressure 124/76. Weight is 195 pounds. Head and neck exam shows  no ocular or oral lesions. There are no palpable cervical or supraclavicular lymph nodes. Lungs are clear bilaterally. Cardiac exam regular in rhythm with no murmurs rubs or bruits. Abdomen is soft. She has good bowel sounds. There is no fluid wave. There is no palpable liver or spleen tip. Back exam no tenderness over the spine ribs or hips. Extremities shows no clubbing cyanosis or edema. Skin exam no rashes, ecchymosis or petechia. Neurological exam is nonfocal.  Lab Results  Component Value Date   WBC 2.1* 09/29/2013   HGB 13.6 09/29/2013   HCT 39.7 09/29/2013   MCV 91 09/29/2013   PLT 292 09/29/2013     Chemistry   No results found for this basename: NA, K, CL, CO2, BUN, CREATININE, GLU      Component Value Date/Time   ALKPHOS 56 05/11/2008 1625   AST 27 05/11/2008 1625   ALT 36* 05/11/2008 1625   BILITOT 0.8 05/11/2008 1625         Impression and Plan: Ms. Ivins is 4 her old white female. She has autoimmune neutropenia. We have done a bone marrow test on her. This was pre-much unremarkable. She could myeloid progenitors. Cytogenetics were normal.  I looked at her blood smear. Again, she had decreased neutrophils. Her brother white cells appear normal in morphology. There were no nucleated  red cells. Plans for adequate in number and size.  She's been a symptomatic with this neutropenia. I do not  see any role for any type of prophylactic antibiotics. Told her that she needs to get her pneumonia vaccine. She will have this done by her family doctor.  I told her that if she has any temperature over 101, that she is to call us so we get her on antibiotics.  I don't see any role for Neupogen or Neulasta.  We will plan to go back in another 6 months. She knows that she always come back sooner to have blood work done if necessary.   Volanda Napoleon, MD 8/4/201510:33 AM

## 2014-02-08 DIAGNOSIS — M899 Disorder of bone, unspecified: Secondary | ICD-10-CM | POA: Insufficient documentation

## 2014-02-08 DIAGNOSIS — R1310 Dysphagia, unspecified: Secondary | ICD-10-CM | POA: Insufficient documentation

## 2014-04-01 ENCOUNTER — Encounter: Payer: Self-pay | Admitting: Hematology & Oncology

## 2014-04-01 ENCOUNTER — Ambulatory Visit (HOSPITAL_BASED_OUTPATIENT_CLINIC_OR_DEPARTMENT_OTHER): Payer: 59 | Admitting: Hematology & Oncology

## 2014-04-01 ENCOUNTER — Other Ambulatory Visit (HOSPITAL_BASED_OUTPATIENT_CLINIC_OR_DEPARTMENT_OTHER): Payer: 59 | Admitting: Lab

## 2014-04-01 VITALS — BP 134/68 | HR 66 | Temp 97.7°F | Resp 14 | Ht 64.0 in | Wt 197.0 lb

## 2014-04-01 DIAGNOSIS — D708 Other neutropenia: Secondary | ICD-10-CM

## 2014-04-01 DIAGNOSIS — D72819 Decreased white blood cell count, unspecified: Secondary | ICD-10-CM

## 2014-04-01 LAB — CBC WITH DIFFERENTIAL (CANCER CENTER ONLY)
BASO#: 0 10*3/uL (ref 0.0–0.2)
BASO%: 1.8 % (ref 0.0–2.0)
EOS%: 4.1 % (ref 0.0–7.0)
Eosinophils Absolute: 0.1 10*3/uL (ref 0.0–0.5)
HCT: 39.7 % (ref 34.8–46.6)
HEMOGLOBIN: 13.5 g/dL (ref 11.6–15.9)
LYMPH#: 1.3 10*3/uL (ref 0.9–3.3)
LYMPH%: 60.4 % — ABNORMAL HIGH (ref 14.0–48.0)
MCH: 30.9 pg (ref 26.0–34.0)
MCHC: 34 g/dL (ref 32.0–36.0)
MCV: 91 fL (ref 81–101)
MONO#: 0.5 10*3/uL (ref 0.1–0.9)
MONO%: 23.4 % — AB (ref 0.0–13.0)
NEUT%: 10.3 % — ABNORMAL LOW (ref 39.6–80.0)
NEUTROS ABS: 0.2 10*3/uL — AB (ref 1.5–6.5)
Platelets: 309 10*3/uL (ref 145–400)
RBC: 4.37 10*6/uL (ref 3.70–5.32)
RDW: 12 % (ref 11.1–15.7)
WBC: 2.2 10*3/uL — AB (ref 3.9–10.0)

## 2014-04-01 LAB — CHCC SATELLITE - SMEAR

## 2014-04-01 NOTE — Progress Notes (Signed)
Hematology and Oncology Follow Up Visit  Jasmine Castro [id number] 17-May-1955 59 y.o. 04/01/2014   Principle Diagnosis:   Autoimmune neutropenia  Current Therapy:    Observation     Interim History:  Jasmine Castro is back for followup. We last saw her back in August. She's been doing well. She's working without difficulties.   She had a good holiday season. Her son, who is in [employer], is back up in [place]. He is back out on duty. He will be getting out of the [employer] later this year.  She still has had no problems with fever, infections, or the flu since we saw her.  There has been no rashes. She's had no bleeding. She's had no change in bowel or bladder habits. That has been no cough. She's had no leg swelling. There's been no change in medications.  Overall, her performance status is ECOG 0.  Medications:  Current outpatient prescriptions:  .  amlodipine-benazepril (LOTREL) 2.5-10 MG per capsule, Take 1 capsule by mouth every morning. , Disp: , Rfl:  .  Ascorbic Acid (VITAMIN C) 1000 MG tablet, Take 1,000 mg by mouth daily., Disp: , Rfl:  .  CALCIUM PO, Take 600 mg by mouth daily. , Disp: , Rfl:  .  cholecalciferol (VITAMIN D) 1000 UNITS tablet, Take 1,000 Units by mouth daily., Disp: , Rfl:  .  fish oil-omega-3 fatty acids 1000 MG capsule, Take 2 g by mouth daily., Disp: , Rfl:  .  Multiple Vitamin (MULTIVITAMIN) capsule, Take 1 capsule by mouth daily., Disp: , Rfl:  .  rosuvastatin (CRESTOR) 5 MG tablet, Take 5 mg by mouth every morning., Disp: , Rfl:  .  telmisartan (MICARDIS) 80 MG tablet, Take 80 mg by mouth every morning. , Disp: , Rfl:  .  VITAMIN E PO, Take 400 mg by mouth daily. , Disp: , Rfl:   Allergies:  Allergies  Allergen Reactions  . Sulfonamide Derivatives     REACTION: swelling    Past Medical History, Surgical history, Social history, and Family History were reviewed and updated.  Review of Systems: As above  Physical Exam:  height is 5\' 4"  (1.626 m)  and weight is 197 lb (89.359 kg). Her oral temperature is 97.7 F (36.5 C). Her blood pressure is 134/68 and her pulse is 66. Her respiration is 14.   Well-developed and well-nourished white female in no obvious distress. Vital signs are temperature of 98.4. Pulse 69. Blood pressure 124/76. Weight is 195 pounds. Head and neck exam shows no ocular or oral lesions. There are no palpable cervical or supraclavicular lymph nodes. Lungs are clear bilaterally. Cardiac exam regular rate and rhythm with no murmurs rubs or bruits. Abdomen is soft. She has good bowel sounds. There is no fluid wave. There is no palpable liver or spleen tip. Back exam no tenderness over the spine ribs or hips. Extremities shows no clubbing cyanosis or edema. Skin exam no rashes, ecchymosis or petechia. Neurological exam is nonfocal.  Lab Results  Component Value Date   WBC 2.2* 04/01/2014   HGB 13.5 04/01/2014   HCT 39.7 04/01/2014   MCV 91 04/01/2014   PLT 309 04/01/2014     Chemistry   No results found for: NA    Component Value Date/Time   ALKPHOS 56 05/11/2008 1625   AST 27 05/11/2008 1625   ALT 36* 05/11/2008 1625   BILITOT 0.8 05/11/2008 1625         Impression and Plan: Jasmine Castro  is 41 her old white female. She has autoimmune neutropenia. We have done a bone marrow test on her. This was back in September 2014. This was pretty much unremarkable. She has good myeloid progenitors. Cytogenetics were normal.  I looked at her blood smear. Again, she has decreased neutrophils. Her white cells appear normal in morphology. There are no hypersegmented polys. There is no immature myeloid or lymphoid forms. Red cells appear normal. There were no nucleated  red cells. Platelets are adequate in number and size.  She's been asymptomatic with this neutropenia. I do not see any role for any type of prophylactic antibiotics.   I told her that if she has any temperature over 101, that she is to call us so we get her on  antibiotics.  I don't see any role for Neupogen or Neulasta.  We will plan to go back in another 6 months. She knows that she always come back sooner to have blood work done if necessary.   Volanda Napoleon, MD 2/4/20166:01 PM

## 2014-04-15 ENCOUNTER — Other Ambulatory Visit (HOSPITAL_COMMUNITY): Payer: Self-pay | Admitting: Obstetrics and Gynecology

## 2014-04-15 DIAGNOSIS — Z1231 Encounter for screening mammogram for malignant neoplasm of breast: Secondary | ICD-10-CM

## 2014-04-28 ENCOUNTER — Ambulatory Visit (INDEPENDENT_AMBULATORY_CARE_PROVIDER_SITE_OTHER): Payer: 59 | Admitting: Physician Assistant

## 2014-04-28 ENCOUNTER — Encounter: Payer: Self-pay | Admitting: Physician Assistant

## 2014-04-28 VITALS — BP 132/84 | HR 93 | Ht 64.0 in | Wt 200.0 lb

## 2014-04-28 DIAGNOSIS — R635 Abnormal weight gain: Secondary | ICD-10-CM

## 2014-04-28 DIAGNOSIS — E669 Obesity, unspecified: Secondary | ICD-10-CM

## 2014-04-28 DIAGNOSIS — I10 Essential (primary) hypertension: Secondary | ICD-10-CM | POA: Insufficient documentation

## 2014-04-28 DIAGNOSIS — E7800 Pure hypercholesterolemia, unspecified: Secondary | ICD-10-CM | POA: Insufficient documentation

## 2014-04-28 LAB — TSH: TSH: 1.194 u[IU]/mL (ref 0.350–4.500)

## 2014-04-28 MED ORDER — PHENTERMINE HCL 15 MG PO CAPS: 15.0000 mg | ORAL_CAPSULE | ORAL | Status: DC

## 2014-04-28 MED ORDER — LORCASERIN HCL 10 MG PO TABS: 1.0000 | ORAL_TABLET | Freq: Two times a day (BID) | ORAL | Status: DC

## 2014-04-28 NOTE — Progress Notes (Signed)
Subjective:    Patient ID: Jasmine Castro, female    DOB: [birth year], 59 y.o.   MRN: [id number]  HPI Patient is a 59 year old female who presents to the clinic to discuss weight loss. She does not want to switch primary care provider; however she would just like me to manage her weight loss. She has tried numerous diets over the past couple years. She has done Weight Watchers as well as [facility] weight loss classes. She can sometimes managed to lose 15-20 pounds but seems to always came back. She would like to discuss other options. She has never tried any medications for weight loss.  .. Active Ambulatory Problems    Diagnosis Date Noted  . ABDOMINAL PAIN-RUQ 05/11/2008  . Leukopenia 01/31/2011  . Mastodynia 03/20/2011  . Obesity 04/28/2014  . Abnormal weight gain 04/28/2014  . Essential hypertension, benign 04/28/2014  . Hyperlipidemia 04/28/2014   Resolved Ambulatory Problems    Diagnosis Date Noted  . No Resolved Ambulatory Problems   Past Medical History  Diagnosis Date  . Anemia   . Hypertension    . Family History  Problem Relation Age of Onset  . Cancer Mother     breast  . Heart disease Maternal Aunt   . Heart disease Maternal Uncle   . Heart disease Maternal Grandmother   . Heart disease Maternal Grandfather    . History   Social History  . Marital Status: Married    Spouse Name: N/A  . Number of Children: N/A  . Years of Education: N/A   Occupational History  . Not on file.   Social History Main Topics  . Smoking status: Never Smoker   . Smokeless tobacco: Never Used     Comment: never used product  . Alcohol Use: No  . Drug Use: No  . Sexual Activity: Yes    Birth Control/ Protection: Surgical   Other Topics Concern  . Not on file   Social History Narrative     Review of Systems  All other systems reviewed and are negative.      Objective:   Physical Exam  Constitutional: She is oriented to person, place, and time. She appears  well-developed and well-nourished.  Obesity.   HENT:  Head: Normocephalic and atraumatic.  Cardiovascular: Normal rate, regular rhythm and normal heart sounds.   Pulmonary/Chest: Effort normal and breath sounds normal. She has no wheezes.  Neurological: She is alert and oriented to person, place, and time.  Skin: Skin is dry.  Psychiatric: She has a normal mood and affect. Her behavior is normal.          Assessment & Plan:  Obesity/abnormal weight gain-BMIs over 30. We did discuss all options of medication weight loss such as belviq, phentermine, contrave, and qsymia. I did hesitate to use phentermine in patients that she has a history of elevated blood pressure. We did decide to start half dose for the first month to give her motivation and hopefully see some results to continue on her weight loss journey. Out of the options we did opt to start belviq bid. Discussed side effects and printed HO for patient. She is aware medication only aids diet and exercise changes. Encouraged 1200-1500 calorie diet with 150 minutes of exercise a week. Frequent small meals to keep metabolism going. Follow up in 3 months. Per pt is borderline diabetic. Brief discussion on starting metformin for insulin resistence to help more with weight loss if weight loss difficult. Pt is to discuss  with P[facility]. Will check TSH today.

## 2014-04-28 NOTE — Patient Instructions (Addendum)
Eat right for your bloodtype.   Lorcaserin oral tablets What is this medicine? LORCASERIN (lor ca SER in) is used to promote and maintain weight loss in obese patients. This medicine should be used with a reduced calorie diet and, if appropriate, an exercise program. This medicine may be used for other purposes; ask your health care provider or pharmacist if you have questions. COMMON BRAND NAME(S): Belviq What should I tell my health care provider before I take this medicine? They need to know if you have any of these conditions: -anatomical deformation of the penis, Peyronie's disease, or history of priapism (painful and prolonged erection) -diabetes -heart disease -history of blood diseases, like sickle cell anemia or leukemia -history of irregular heartbeat -kidney disease -liver disease -suicidal thoughts, plans, or attempt; a previous suicide attempt by you or a family member -an unusual or allergic reaction to lorcaserin, other medicines, foods, dyes, or preservatives -pregnant or trying to get pregnant -breast-feeding How should I use this medicine? Take this medicine by mouth with a glass of water. Follow the directions on the prescription label. You can take it with or without food. Take your medicine at regular intervals. Do not take it more often than directed. Do not stop taking except on your doctor's advice. Talk to your pediatrician regarding the use of this medicine in children. Special care may be needed. Overdosage: If you think you've taken too much of this medicine contact a poison control center or emergency room at once. Overdosage: If you think you have taken too much of this medicine contact a poison control center or emergency room at once. NOTE: This medicine is only for you. Do not share this medicine with others. What if I miss a dose? If you miss a dose, take it as soon as you can. If it is almost time for your next dose, take only that dose. Do not take  double or extra doses. What may interact with this medicine? -cabergoline -certain medicines for depression, anxiety, or psychotic disturbances -certain medicines for erectile dysfunction -certain medicines for migraine headache like almotriptan, eletriptan, frovatriptan, naratriptan, rizatriptan, sumatriptan, zolmitriptan -dextromethorphan -linezolid -MAOIs like Carbex, Eldepryl, Marplan, Nardil, and Parnate -medicines for diabetes -orlistat -tramadol -St. John's Wort -stimulant medicines for attention disorders, weight loss, or to stay awake This list may not describe all possible interactions. Give your health care provider a list of all the medicines, herbs, non-prescription drugs, or dietary supplements you use. Also tell them if you smoke, drink alcohol, or use illegal drugs. Some items may interact with your medicine. What should I watch for while using this medicine? This medicine is intended to be used in addition to a healthy diet and appropriate exercise. The best results are achieved this way. Do not increase or in any way change your dose without consulting your doctor or health care professional. Your doctor should tell you to stop taking this medicine if you do not lose a certain amount of weight within the first 12 weeks of treatment. Visit your doctor or health care professional for regular checkups. Your doctor may order blood tests or other tests to see how you are doing. Do not drive, use machinery, or do anything that needs mental alertness until you know how this medicine affects you. This medicine may affect blood sugar levels. If you have diabetes, check with your doctor or health care professional before you change your diet or the dose of your diabetic medicine. Patients and their families should watch out for  worsening depression or thoughts of suicide. Also watch out for sudden changes in feelings such as feeling anxious, agitated, panicky, irritable, hostile,  aggressive, impulsive, severely restless, overly excited and hyperactive, or not being able to sleep. If this happens, especially at the beginning of treatment or after a change in dose, call your health care professional. Contact you doctor or health care professional right away if the erection lasts longer than 4 hours or if it becomes painful. This may be a sign of serious problem and must be treated right away to prevent permanent damage. What side effects may I notice from receiving this medicine? Side effects that you should report to your doctor or health care professional as soon as possible: -allergic reactions like skin rash, itching or hives, swelling of the face, lips, or tongue -abnormal production of milk -breast enlargement in both males and females -breathing problems -changes in emotions or moods -changes in vision -confusion -erection lasting more than 4 hours -fast or irregular heart beat -feeling faint or lightheaded, falls -fever or chills, sore throat -hallucination, loss of contact with reality -high or low blood pressure -menstrual changes -restlessness -seizures -slow or irregular heartbeat -stiff muscles -sweating -suicidal thoughts or other mood changes -tremors -trouble walking -unusually weak or tired -vomiting Side effects that usually do not require medical attention (Report these to your doctor or health care professional if they continue or are bothersome.): -back pain -constipation -cough -dizziness -dry mouth -low blood sugar if you have diabetes (ask your doctor or healthcare professional for a list of these symptoms) -nausea -tiredness This list may not describe all possible side effects. Call your doctor for medical advice about side effects. You may report side effects to FDA at 1-800-FDA-1088. Where should I keep my medicine? Keep out of the reach of children. Store at room temperature between 15 and 30 degrees C (59 and 86 degrees F).  Throw away any unused medicine after the expiration date. NOTE: This sheet is a summary. It may not cover all possible information. If you have questions about this medicine, talk to your doctor, pharmacist, or health care provider.  2015, Elsevier/Gold Standard. (2010-08-29 17:15:17)

## 2014-05-26 ENCOUNTER — Telehealth: Payer: Self-pay | Admitting: *Deleted

## 2014-05-26 ENCOUNTER — Other Ambulatory Visit: Payer: Self-pay | Admitting: *Deleted

## 2014-05-26 NOTE — Telephone Encounter (Signed)
Routed note to [facility].

## 2014-05-26 NOTE — Telephone Encounter (Signed)
Phentermine was to jump start weight loss in combination with belviq. belviq to be continued and stopping phentermine.

## 2014-05-26 NOTE — Telephone Encounter (Signed)
Pt left vm asking for phentermine refill.  Were you only giving her this for the first month with the belviq or was this to be continued? I saw the note where she is to f/u in 3 months but was unsure what you wanted to do with phentermine.  Please advise.

## 2014-05-27 NOTE — Telephone Encounter (Signed)
Pt has not started the belviq because it is too expensive for her

## 2014-05-28 MED ORDER — PHENTERMINE HCL 37.5 MG PO CAPS: 37.5000 mg | ORAL_CAPSULE | ORAL | Status: DC

## 2014-05-28 NOTE — Telephone Encounter (Signed)
[place] for 15 more tablets but needs OV to make sure vitals are good.

## 2014-05-31 ENCOUNTER — Ambulatory Visit (HOSPITAL_COMMUNITY)
Admission: RE | Admit: 2014-05-31 | Discharge: 2014-05-31 | Disposition: A | Discharge status: Home / Self Care | Payer: 59 | Source: Ambulatory Visit | Attending: Obstetrics and Gynecology | Admitting: Obstetrics and Gynecology

## 2014-05-31 DIAGNOSIS — Z1231 Encounter for screening mammogram for malignant neoplasm of breast: Secondary | ICD-10-CM | POA: Diagnosis not present

## 2014-06-09 ENCOUNTER — Telehealth: Payer: Self-pay | Admitting: *Deleted

## 2014-06-09 NOTE — Telephone Encounter (Signed)
Patient's weight loss MD has started patient on Belziq. Wants to make sure this is okay with Dr Marin Olp. Dr Marin Olp aware, and is fine with patient taking.

## 2014-06-15 ENCOUNTER — Ambulatory Visit (INDEPENDENT_AMBULATORY_CARE_PROVIDER_SITE_OTHER): Payer: 59 | Admitting: Physician Assistant

## 2014-06-15 ENCOUNTER — Encounter: Payer: Self-pay | Admitting: Physician Assistant

## 2014-06-15 VITALS — BP 126/79 | HR 96 | Ht 64.0 in | Wt 185.0 lb

## 2014-06-15 DIAGNOSIS — R635 Abnormal weight gain: Secondary | ICD-10-CM | POA: Diagnosis not present

## 2014-06-15 DIAGNOSIS — E669 Obesity, unspecified: Secondary | ICD-10-CM | POA: Diagnosis not present

## 2014-06-15 MED ORDER — PHENTERMINE HCL 37.5 MG PO CAPS: 37.5000 mg | ORAL_CAPSULE | ORAL | Status: DC

## 2014-06-15 NOTE — Progress Notes (Signed)
   Subjective:    Patient ID: TAUNIA FRASCO, female    DOB: March 20, 1955, 59 y.o.   MRN: [id number]  HPI Pt presents to the clinic to follow up on obesity and weight loss. She tried belviq and loved it but would cost 136 dollars a month. She just can't afford that. We sent phentermine and doing great. Down 15lbs in one month. No side effects except some dry mouth. Using fit bit and counting steps. Decreased calories and eating smaller meals. Feels great.    Review of Systems  All other systems reviewed and are negative.      Objective:   Physical Exam  Constitutional: She is oriented to person, place, and time. She appears well-developed and well-nourished.  HENT:  Head: Normocephalic and atraumatic.  Cardiovascular: Normal rate, regular rhythm and normal heart sounds.   Pulmonary/Chest: Effort normal and breath sounds normal. She has no wheezes.  Neurological: She is alert and oriented to person, place, and time.  Skin: Skin is dry.  Psychiatric: She has a normal mood and affect. Her behavior is normal.          Assessment & Plan:  Obesity/abnormal weight gain- phentermine 37.5 refilled for 3 months. Follow up in 3 months. Continue diet and exercise plan. Follow up with and side effects. Discussed other long term weight loss options but would like to stay on phentermine right now.

## 2014-09-14 ENCOUNTER — Ambulatory Visit (INDEPENDENT_AMBULATORY_CARE_PROVIDER_SITE_OTHER): Payer: 59 | Admitting: Family Medicine

## 2014-09-14 ENCOUNTER — Encounter: Payer: Self-pay | Admitting: Family Medicine

## 2014-09-14 VITALS — BP 120/73 | HR 83 | Ht 64.0 in | Wt 174.0 lb

## 2014-09-14 DIAGNOSIS — R635 Abnormal weight gain: Secondary | ICD-10-CM

## 2014-09-14 DIAGNOSIS — Z6829 Body mass index (BMI) 29.0-29.9, adult: Secondary | ICD-10-CM

## 2014-09-14 MED ORDER — PHENTERMINE HCL 37.5 MG PO CAPS: 37.5000 mg | ORAL_CAPSULE | ORAL | Status: DC

## 2014-09-14 NOTE — Progress Notes (Signed)
   Subjective:    Patient ID: Jasmine Castro, female    DOB: 1955-05-15, 59 y.o.   MRN: [id number]  HPI Abnormal weight gain - previously on Belviq and did well but too costly. Now on phentermine. Has lost 11 lbs in the last 3 months.  She would like to lose about 25lb. She has been walking some.  Work has been stressful.  She feels like she is plateauing.  No CP or SOB or hear flutters on the medications.    Review of Systems     Objective:   Physical Exam  Constitutional: She is oriented to person, place, and time. She appears well-developed and well-nourished.  HENT:  Head: Normocephalic and atraumatic.  Cardiovascular: Normal rate, regular rhythm and normal heart sounds.   Pulmonary/Chest: Effort normal and breath sounds normal.  Neurological: She is alert and oriented to person, place, and time.  Skin: Skin is warm and dry.  Psychiatric: She has a normal mood and affect. Her behavior is normal.          Assessment & Plan:  Abnormal weight gain - /BMI 29-  She is doing well. Refill med for 90 day rx. F/Uin 3 months with Jade. REcommend she reset her weight in her My Fitness pal app that she is using to track her calories. Also asked her to make sure she is exercising regularly to boost her metabolism.    Time spent 15 min, > 50% spent counseling about weight loss and strategies to reach her goals.

## 2014-09-30 ENCOUNTER — Other Ambulatory Visit (HOSPITAL_BASED_OUTPATIENT_CLINIC_OR_DEPARTMENT_OTHER): Payer: 59

## 2014-09-30 ENCOUNTER — Encounter: Payer: Self-pay | Admitting: Family

## 2014-09-30 ENCOUNTER — Ambulatory Visit (HOSPITAL_BASED_OUTPATIENT_CLINIC_OR_DEPARTMENT_OTHER): Payer: 59 | Admitting: Family

## 2014-09-30 ENCOUNTER — Telehealth: Payer: Self-pay | Admitting: *Deleted

## 2014-09-30 VITALS — BP 126/67 | HR 79 | Temp 98.1°F | Resp 16 | Ht 64.0 in | Wt 175.0 lb

## 2014-09-30 DIAGNOSIS — D708 Other neutropenia: Secondary | ICD-10-CM | POA: Diagnosis not present

## 2014-09-30 DIAGNOSIS — D72819 Decreased white blood cell count, unspecified: Secondary | ICD-10-CM

## 2014-09-30 LAB — CBC WITH DIFFERENTIAL (CANCER CENTER ONLY)
BASO#: 0 10*3/uL (ref 0.0–0.2)
BASO%: 0.8 % (ref 0.0–2.0)
EOS ABS: 0.1 10*3/uL (ref 0.0–0.5)
EOS%: 5.1 % (ref 0.0–7.0)
HEMATOCRIT: 39.4 % (ref 34.8–46.6)
HGB: 13.5 g/dL (ref 11.6–15.9)
LYMPH#: 1.3 10*3/uL (ref 0.9–3.3)
LYMPH%: 55.7 % — AB (ref 14.0–48.0)
MCH: 31.3 pg (ref 26.0–34.0)
MCHC: 34.3 g/dL (ref 32.0–36.0)
MCV: 91 fL (ref 81–101)
MONO#: 0.5 10*3/uL (ref 0.1–0.9)
MONO%: 22.4 % — ABNORMAL HIGH (ref 0.0–13.0)
NEUT%: 16 % — ABNORMAL LOW (ref 39.6–80.0)
NEUTROS ABS: 0.4 10*3/uL — AB (ref 1.5–6.5)
PLATELETS: 291 10*3/uL (ref 145–400)
RBC: 4.32 10*6/uL (ref 3.70–5.32)
RDW: 11.9 % (ref 11.1–15.7)
WBC: 2.4 10*3/uL — AB (ref 3.9–10.0)

## 2014-09-30 LAB — CHCC SATELLITE - SMEAR

## 2014-09-30 NOTE — Progress Notes (Signed)
Hematology and Oncology Follow Up Visit  KAEDENCE CONNELLY [id number] [birth year] 59 y.o. 09/30/2014   Principle Diagnosis:   Autoimmune neutropenia  Current Therapy:   Observation    Interim History:  Ms. Addison is here today for a follow-up. She is doing well and has no complaints at this time.  She has had no problem with infections. No fever, chills, n/v, cough, rash, dizziness, SOB, chest pain, palpitations, abdominal pain, changes in her bowel or bladder habits. No blood in her urine or still.  No swelling, tenderness, numbness or tinging in her extremities. No new aches or pains.  She has a good appetite and is staying hydrated. She stay active and states that she has lost 25 lbs in the last year by watching her diet and walking when she can. She works full time and helps take care of both her parents and her husbands parents.  Her son is now out of the [employer] and plans to go to college this fall.   Medications:    Medication List       This list is accurate as of: 09/30/14  9:40 AM.  Always use your most recent med list.               amlodipine-benazepril 2.5-10 MG per capsule  Commonly known as:  LOTREL  Take 1 capsule by mouth every morning.     CALCIUM PO  Take 600 mg by mouth daily.     cholecalciferol 1000 UNITS tablet  Commonly known as:  VITAMIN D  Take 1,000 Units by mouth daily.     fish oil-omega-3 fatty acids 1000 MG capsule  Take 2 g by mouth daily.     multivitamin capsule  Take 1 capsule by mouth daily.     phentermine 37.5 MG capsule  Take 1 capsule (37.5 mg total) by mouth every morning.     rosuvastatin 5 MG tablet  Commonly known as:  CRESTOR  Take 5 mg by mouth every morning.     telmisartan 80 MG tablet  Commonly known as:  MICARDIS  Take 80 mg by mouth every morning.     vitamin C 1000 MG tablet  Take 1,000 mg by mouth daily.     VITAMIN E PO  Take 400 mg by mouth daily.        Allergies:  Allergies  Allergen Reactions  .  Sulfonamide Derivatives     REACTION: swelling    Past Medical History, Surgical history, Social history, and Family History were reviewed and updated.  Review of Systems: All other 10 point review of systems is negative.   Physical Exam:  height is _0  (1.626 m) and weight is 175 lb (79.379 kg). Her oral temperature is 98.1 F (36.7 C). Her blood pressure is 126/67 and her pulse is 79. Her respiration is 16.   Wt Readings from Last 3 Encounters:  09/30/14 175 lb (79.379 kg)  09/14/14 174 lb (78.926 kg)  06/15/14 185 lb (83.915 kg)    Ocular: Sclerae unicteric, pupils equal, round and reactive to light Ear-nose-throat: Oropharynx clear, dentition fair Lymphatic: No cervical or supraclavicular adenopathy Lungs no rales or rhonchi, good excursion bilaterally Heart regular rate and rhythm, no murmur appreciated Abd soft, nontender, positive bowel sounds MSK no focal spinal tenderness, no joint edema Neuro: non-focal, well-oriented, appropriate affect Breasts: Deferred  Lab Results  Component Value Date   WBC 2.4* 09/30/2014   HGB 13.5 09/30/2014   HCT 39.4 09/30/2014  MCV 91 09/30/2014   PLT 291 09/30/2014   Lab Results  Component Value Date   FERRITIN 49.2 05/13/2008   IRON 58 05/13/2008   IRONPCTSAT 15.4* 05/13/2008   Lab Results  Component Value Date   RBC 4.32 09/30/2014   No results found for: KPAFRELGTCHN, LAMBDASER, KAPLAMBRATIO No results found for: IGGSERUM, IGA, IGMSERUM No results found for: TOTALPROTELP, ALBUMINELP, A1GS, A2GS, BETS, BETA2SER, GAMS, MSPIKE, SPEI   Chemistry   No results found for: NA, K, CL, CO2, BUN, CREATININE, GLU    Component Value Date/Time   ALKPHOS 56 05/11/2008 1625   AST 27 05/11/2008 1625   ALT 36* 05/11/2008 1625   BILITOT 0.8 05/11/2008 1625     Impression and Plan: Ms. Detwiler is a very pleasant 59 yo white female with autoimmune neutropenia. Her bone marrow biopsy in 2014 was unremarkable. So far, this has not been  an issues for her. She is doing well and is asymptomatic at this time.  Her ANC is 0.4 today. This is "normal" for her. We will continue to follow along and watch this.  Dr. Marin Olp will view her smear.  We will plan to see her back in 6 months for labs and follow-up.  She knows to contact us with any questions or concerns. If she has a temp over 101 she will notify us. We can certainly see her sooner if need be.   Eliezer Bottom, NP 8/4/20169:40 AM

## 2014-09-30 NOTE — Telephone Encounter (Signed)
Critical Value Neutrophils 0.4 Laverna Peace NP notified. No orders at this time.

## 2014-10-29 ENCOUNTER — Encounter: Payer: Self-pay | Admitting: Internal Medicine

## 2014-12-15 ENCOUNTER — Ambulatory Visit: Payer: 59 | Admitting: Physician Assistant

## 2015-03-03 ENCOUNTER — Other Ambulatory Visit: Payer: Self-pay

## 2015-03-03 DIAGNOSIS — I10 Essential (primary) hypertension: Secondary | ICD-10-CM | POA: Diagnosis not present

## 2015-03-03 DIAGNOSIS — E559 Vitamin D deficiency, unspecified: Secondary | ICD-10-CM | POA: Insufficient documentation

## 2015-03-03 DIAGNOSIS — M859 Disorder of bone density and structure, unspecified: Secondary | ICD-10-CM | POA: Diagnosis not present

## 2015-03-03 DIAGNOSIS — N951 Menopausal and female climacteric states: Secondary | ICD-10-CM | POA: Diagnosis not present

## 2015-03-03 DIAGNOSIS — R1310 Dysphagia, unspecified: Secondary | ICD-10-CM | POA: Diagnosis not present

## 2015-03-03 DIAGNOSIS — Z1231 Encounter for screening mammogram for malignant neoplasm of breast: Secondary | ICD-10-CM

## 2015-03-03 DIAGNOSIS — J069 Acute upper respiratory infection, unspecified: Secondary | ICD-10-CM | POA: Diagnosis not present

## 2015-03-03 DIAGNOSIS — E784 Other hyperlipidemia: Secondary | ICD-10-CM | POA: Diagnosis not present

## 2015-03-03 DIAGNOSIS — Z0000 Encounter for general adult medical examination without abnormal findings: Secondary | ICD-10-CM | POA: Diagnosis not present

## 2015-03-03 DIAGNOSIS — R739 Hyperglycemia, unspecified: Secondary | ICD-10-CM | POA: Diagnosis not present

## 2015-03-03 DIAGNOSIS — K760 Fatty (change of) liver, not elsewhere classified: Secondary | ICD-10-CM | POA: Diagnosis not present

## 2015-03-14 DIAGNOSIS — Z1212 Encounter for screening for malignant neoplasm of rectum: Secondary | ICD-10-CM | POA: Diagnosis not present

## 2015-03-27 IMAGING — CT CT BIOPSY
1 series · 1 of 15 positions shown · non-contrast
Comparison: none

CLINICAL DATA: Leukopenia and need for bone marrow biopsy.

[Series 2: localizer · axial · 5.0mm · 0.59mm/px · 1 of 15 slices shown]
[im 8/15]
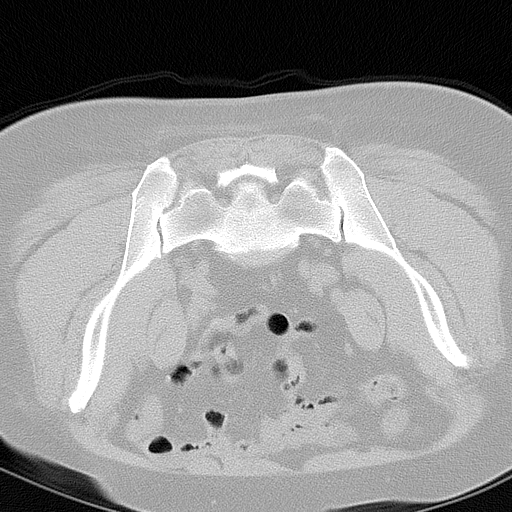

[1 of 15 positions shown; findings below may reference images not displayed]

CT GUIDED ASPIRATE AND CORE BIOPSY OF RIGHT ILIAC BONE MARROW

Sedation: Versed 2.0 mg IV, Fentanyl 100 mcg IV

Total Moderate Sedation Time: 12 minutes.

Procedure:  The procedure risks, benefits, and alternatives were
explained to the patient.  Questions regarding the procedure were
encouraged and answered.  The patient understands and consents to
the procedure.

The right gluteal region was prepped with Betadine.  Sterile gown
and sterile gloves were used for the procedure.  Local anesthesia
was provided with 1% Lidocaine.

Under CT guidance, an 11 gauge OnControl bone cutting needle was
advanced from a posterior approach into the right iliac bone.
Needle positioning was confirmed with CT.  Initial non heparinized
and heparinized aspirate samples were obtained of bone marrow.

Core biopsy was performed with the outer needle.  An intact core
biopsy specimen was obtained.  Complications: None
FINDINGS: Inspection of initial aspirate did reveal visible
particles.  Intact core biopsy sample was obtained.
IMPRESSION: CT guided bone marrow biopsy of right posterior iliac bone with
both aspirate and core samples obtained.

## 2015-04-01 MED FILL — TELMISARTAN 80 MG TABLET: 80 | 90 days supply | Qty: 90 | Fill #3

## 2015-04-05 ENCOUNTER — Ambulatory Visit (HOSPITAL_BASED_OUTPATIENT_CLINIC_OR_DEPARTMENT_OTHER): Payer: 59 | Admitting: Hematology & Oncology

## 2015-04-05 ENCOUNTER — Other Ambulatory Visit (HOSPITAL_BASED_OUTPATIENT_CLINIC_OR_DEPARTMENT_OTHER): Payer: 59

## 2015-04-05 VITALS — BP 114/68 | HR 70 | Temp 98.0°F | Resp 2 | Wt 180.0 lb

## 2015-04-05 DIAGNOSIS — D708 Other neutropenia: Secondary | ICD-10-CM | POA: Diagnosis not present

## 2015-04-05 DIAGNOSIS — D72819 Decreased white blood cell count, unspecified: Secondary | ICD-10-CM

## 2015-04-05 LAB — CBC WITH DIFFERENTIAL (CANCER CENTER ONLY)
BASO#: 0 10*3/uL (ref 0.0–0.2)
BASO%: 0.5 % (ref 0.0–2.0)
EOS ABS: 0.1 10*3/uL (ref 0.0–0.5)
EOS%: 2.4 % (ref 0.0–7.0)
HEMATOCRIT: 40.6 % (ref 34.8–46.6)
HEMOGLOBIN: 13.7 g/dL (ref 11.6–15.9)
LYMPH#: 1.7 10*3/uL (ref 0.9–3.3)
LYMPH%: 45.4 % (ref 14.0–48.0)
MCH: 30.4 pg (ref 26.0–34.0)
MCHC: 33.7 g/dL (ref 32.0–36.0)
MCV: 90 fL (ref 81–101)
MONO#: 0.7 10*3/uL (ref 0.1–0.9)
MONO%: 20.1 % — ABNORMAL HIGH (ref 0.0–13.0)
NEUT%: 31.6 % — ABNORMAL LOW (ref 39.6–80.0)
NEUTROS ABS: 1.2 10*3/uL — AB (ref 1.5–6.5)
Platelets: 316 10*3/uL (ref 145–400)
RBC: 4.51 10*6/uL (ref 3.70–5.32)
RDW: 12.2 % (ref 11.1–15.7)
WBC: 3.7 10*3/uL — AB (ref 3.9–10.0)

## 2015-04-05 LAB — CHCC SATELLITE - SMEAR

## 2015-04-05 NOTE — Progress Notes (Signed)
Hematology and Oncology Follow Up Visit  Jasmine Castro [id number] Nov 01, 1955 60 y.o. 04/05/2015   Principle Diagnosis:   Autoimmune neutropenia  Current Therapy:    Observation     Interim History:  Ms.  Castro is back for followup. She is doing quite well. She is stressed out at work. Hopefully, she will be able to retire soon.  Her son is out of the [employer] and going to college at [employer]. She is quite excited about this.  She's had no issues with infections. She's had no rashes. She's had no weight loss or weight gain. There's not been any change in medications. She's had no cough or shortness of breath. She's had no issues with the flu.  She is due for mammogram in April.   Overall, her performance status is ECOG 0.  Medications:  Current outpatient prescriptions:  .  amlodipine-benazepril (LOTREL) 2.5-10 MG per capsule, Take 1 capsule by mouth every morning. , Disp: , Rfl:  .  CALCIUM PO, Take 600 mg by mouth daily. , Disp: , Rfl:  .  cholecalciferol (VITAMIN D) 1000 UNITS tablet, Take 1,000 Units by mouth daily., Disp: , Rfl:  .  fish oil-omega-3 fatty acids 1000 MG capsule, Take 2 g by mouth daily., Disp: , Rfl:  .  Multiple Vitamin (MULTIVITAMIN) capsule, Take 1 capsule by mouth daily., Disp: , Rfl:  .  phentermine 37.5 MG capsule, Take 1 capsule (37.5 mg total) by mouth every morning., Disp: 90 capsule, Rfl: 0 .  rosuvastatin (CRESTOR) 5 MG tablet, Take 5 mg by mouth every morning., Disp: , Rfl:  .  VITAMIN E PO, Take 400 mg by mouth daily. , Disp: , Rfl:  .  Ascorbic Acid (VITAMIN C) 1000 MG tablet, Take 1,000 mg by mouth daily., Disp: , Rfl:  .  telmisartan (MICARDIS) 80 MG tablet, Take 80 mg by mouth every morning. , Disp: , Rfl:   Allergies:  Allergies  Allergen Reactions  . Sulfonamide Derivatives     REACTION: swelling    Past Medical History, Surgical history, Social history, and Family History were reviewed and updated.  Review of Systems: As  above  Physical Exam:  weight is 180 lb (81.647 kg). Her oral temperature is 98 F (36.7 C). Her blood pressure is 114/68 and her pulse is 70. Her respiration is 2.   Well-developed and well-nourished white female in no obvious distress. Vital signs are temperature of 98.4. Pulse 69. Blood pressure 124/76. Weight is 195 pounds. Head and neck exam shows no ocular or oral lesions. There are no palpable cervical or supraclavicular lymph nodes. Lungs are clear bilaterally. Cardiac exam regular rate and rhythm with no murmurs rubs or bruits. Abdomen is soft. She has good bowel sounds. There is no fluid wave. There is no palpable liver or spleen tip. Back exam no tenderness over the spine ribs or hips. Extremities shows no clubbing cyanosis or edema. Skin exam no rashes, ecchymosis or petechia. Neurological exam is nonfocal.  Lab Results  Component Value Date   WBC 3.7* 04/05/2015   HGB 13.7 04/05/2015   HCT 40.6 04/05/2015   MCV 90 04/05/2015   PLT 316 04/05/2015     Chemistry   No results found for: NA    Component Value Date/Time   ALKPHOS 56 05/11/2008 1625   AST 27 05/11/2008 1625   ALT 36* 05/11/2008 1625   BILITOT 0.8 05/11/2008 1625         Impression and Plan: Jasmine Castro  is 60 her old white female. She has autoimmune neutropenia. We have done a bone marrow test on her. This was back in September 2014. This was pretty much unremarkable. She has good myeloid progenitors. Cytogenetics were normal.  I am very excited that her white cell count is up a little bit. I'm not sure as to what the real reason why for her white cells to be up.  She's been totally asymptomatic.  We will go ahead and plan to get her back in 6 more months. I don't think we need any lab work in between visits.    Volanda Napoleon, MD 2/7/201710:12 AM

## 2015-04-08 MED FILL — ROSUVASTATIN CALCIUM 5 MG T: 5 | 90 days supply | Qty: 90 | Fill #1

## 2015-05-03 MED FILL — AMLODIPINE BESYLATE 2.5 MG: 2.5 | 90 days supply | Qty: 90 | Fill #2

## 2015-05-18 DIAGNOSIS — H04123 Dry eye syndrome of bilateral lacrimal glands: Secondary | ICD-10-CM | POA: Diagnosis not present

## 2015-05-18 DIAGNOSIS — H2513 Age-related nuclear cataract, bilateral: Secondary | ICD-10-CM | POA: Diagnosis not present

## 2015-05-19 MED FILL — LOTEMAX 0.5% GEL: 0.5 | 10 days supply | Qty: 5 | Fill #0

## 2015-06-01 ENCOUNTER — Ambulatory Visit
Admission: RE | Admit: 2015-06-01 | Discharge: 2015-06-01 | Disposition: A | Discharge status: Home / Self Care | Payer: 59 | Source: Ambulatory Visit

## 2015-06-01 DIAGNOSIS — Z1231 Encounter for screening mammogram for malignant neoplasm of breast: Secondary | ICD-10-CM

## 2015-06-02 DIAGNOSIS — H04123 Dry eye syndrome of bilateral lacrimal glands: Secondary | ICD-10-CM | POA: Diagnosis not present

## 2015-06-06 MED FILL — RESTASIS MULTIDOSE 0.05% EY: 0.05 | 90 days supply | Qty: 17 | Fill #0

## 2015-06-21 DIAGNOSIS — L812 Freckles: Secondary | ICD-10-CM | POA: Diagnosis not present

## 2015-06-21 DIAGNOSIS — D235 Other benign neoplasm of skin of trunk: Secondary | ICD-10-CM | POA: Diagnosis not present

## 2015-06-21 DIAGNOSIS — Z85820 Personal history of malignant melanoma of skin: Secondary | ICD-10-CM | POA: Diagnosis not present

## 2015-07-04 MED FILL — TELMISARTAN 80 MG TABLET: 80 | 90 days supply | Qty: 90 | Fill #0

## 2015-07-13 DIAGNOSIS — R202 Paresthesia of skin: Secondary | ICD-10-CM | POA: Diagnosis not present

## 2015-07-13 DIAGNOSIS — I10 Essential (primary) hypertension: Secondary | ICD-10-CM | POA: Diagnosis not present

## 2015-07-13 DIAGNOSIS — Z6833 Body mass index (BMI) 33.0-33.9, adult: Secondary | ICD-10-CM | POA: Diagnosis not present

## 2015-07-13 DIAGNOSIS — R002 Palpitations: Secondary | ICD-10-CM | POA: Insufficient documentation

## 2015-07-13 MED FILL — ROSUVASTATIN CALCIUM 5 MG T: 5 | 90 days supply | Qty: 90 | Fill #2

## 2015-07-29 MED FILL — AMLODIPINE BESYLATE 2.5 MG: 2.5 | 90 days supply | Qty: 90 | Fill #3

## 2015-09-01 DIAGNOSIS — E559 Vitamin D deficiency, unspecified: Secondary | ICD-10-CM | POA: Diagnosis not present

## 2015-09-01 DIAGNOSIS — E669 Obesity, unspecified: Secondary | ICD-10-CM | POA: Diagnosis not present

## 2015-09-01 DIAGNOSIS — E784 Other hyperlipidemia: Secondary | ICD-10-CM | POA: Diagnosis not present

## 2015-09-01 DIAGNOSIS — R002 Palpitations: Secondary | ICD-10-CM | POA: Diagnosis not present

## 2015-09-01 DIAGNOSIS — I10 Essential (primary) hypertension: Secondary | ICD-10-CM | POA: Diagnosis not present

## 2015-09-01 DIAGNOSIS — R739 Hyperglycemia, unspecified: Secondary | ICD-10-CM | POA: Diagnosis not present

## 2015-09-01 DIAGNOSIS — D72819 Decreased white blood cell count, unspecified: Secondary | ICD-10-CM | POA: Diagnosis not present

## 2015-09-01 DIAGNOSIS — Z6833 Body mass index (BMI) 33.0-33.9, adult: Secondary | ICD-10-CM | POA: Diagnosis not present

## 2015-09-13 MED FILL — RESTASIS MULTIDOSE 0.05% EY: 0.05 | 90 days supply | Qty: 17 | Fill #0

## 2015-09-26 MED FILL — TELMISARTAN 80 MG TABLET: 80 | 90 days supply | Qty: 90 | Fill #1

## 2015-10-17 MED FILL — ROSUVASTATIN CALCIUM 5 MG T: 5 | 90 days supply | Qty: 90 | Fill #0

## 2015-10-17 MED FILL — AMLODIPINE BESYLATE 2.5 MG: 2.5 | 90 days supply | Qty: 90 | Fill #0

## 2015-10-20 ENCOUNTER — Other Ambulatory Visit (HOSPITAL_BASED_OUTPATIENT_CLINIC_OR_DEPARTMENT_OTHER): Payer: 59

## 2015-10-20 ENCOUNTER — Encounter: Payer: Self-pay | Admitting: Hematology & Oncology

## 2015-10-20 ENCOUNTER — Ambulatory Visit (HOSPITAL_BASED_OUTPATIENT_CLINIC_OR_DEPARTMENT_OTHER): Payer: 59 | Admitting: Hematology & Oncology

## 2015-10-20 VITALS — BP 126/91 | HR 75 | Temp 98.0°F | Resp 18 | Ht 64.0 in | Wt 192.0 lb

## 2015-10-20 DIAGNOSIS — D72819 Decreased white blood cell count, unspecified: Secondary | ICD-10-CM

## 2015-10-20 DIAGNOSIS — D708 Other neutropenia: Secondary | ICD-10-CM

## 2015-10-20 LAB — CBC WITH DIFFERENTIAL (CANCER CENTER ONLY)
BASO#: 0 10*3/uL (ref 0.0–0.2)
BASO%: 1 % (ref 0.0–2.0)
EOS%: 4.3 % (ref 0.0–7.0)
Eosinophils Absolute: 0.1 10*3/uL (ref 0.0–0.5)
HEMATOCRIT: 40.3 % (ref 34.8–46.6)
HGB: 13.8 g/dL (ref 11.6–15.9)
LYMPH#: 1.5 10*3/uL (ref 0.9–3.3)
LYMPH%: 50.5 % — ABNORMAL HIGH (ref 14.0–48.0)
MCH: 31.1 pg (ref 26.0–34.0)
MCHC: 34.2 g/dL (ref 32.0–36.0)
MCV: 91 fL (ref 81–101)
MONO#: 0.7 10*3/uL (ref 0.1–0.9)
MONO%: 23 % — AB (ref 0.0–13.0)
NEUT%: 21.2 % — ABNORMAL LOW (ref 39.6–80.0)
NEUTROS ABS: 0.7 10*3/uL — AB (ref 1.5–6.5)
Platelets: 306 10*3/uL (ref 145–400)
RBC: 4.44 10*6/uL (ref 3.70–5.32)
RDW: 12.2 % (ref 11.1–15.7)
WBC: 3.1 10*3/uL — AB (ref 3.9–10.0)

## 2015-10-20 LAB — CHCC SATELLITE - SMEAR

## 2015-10-20 NOTE — Progress Notes (Signed)
Hematology and Oncology Follow Up Visit  Jasmine Castro [id number] 1955-11-26 60 y.o. 10/20/2015   Principle Diagnosis:   Autoimmune neutropenia  Current Therapy:    Observation     Interim History:  Ms.  Castro is back for followup. She is doing quite well. She is stressed out at work. Hopefully, she will be able to retire soon.  Overall, she is doing quite well. She try and help take care of her parents. She is an only child so everything falls onto her.  She has had a problem with infections. She's had no rashes. She's had no change in bowel or bladder habits. There's been no weight loss or weight gain. She's had no bleeding or bruising.   Her son, who is now out of the [employer], is at [employer]. He is active going to try to go to firefighting school.    Overall, her performance status is ECOG 0.  Medications:  Current Outpatient Prescriptions:  .  amlodipine-benazepril (LOTREL) 2.5-10 MG per capsule, Take 1 capsule by mouth every morning. , Disp: , Rfl:  .  Ascorbic Acid (VITAMIN C) 1000 MG tablet, Take 1,000 mg by mouth daily., Disp: , Rfl:  .  CALCIUM PO, Take 600 mg by mouth daily. , Disp: , Rfl:  .  cholecalciferol (VITAMIN D) 1000 UNITS tablet, Take 1,000 Units by mouth daily., Disp: , Rfl:  .  fish oil-omega-3 fatty acids 1000 MG capsule, Take 2 g by mouth daily., Disp: , Rfl:  .  Multiple Vitamin (MULTIVITAMIN) capsule, Take 1 capsule by mouth daily., Disp: , Rfl:  .  phentermine 37.5 MG capsule, Take 1 capsule (37.5 mg total) by mouth every morning., Disp: 90 capsule, Rfl: 0 .  rosuvastatin (CRESTOR) 5 MG tablet, Take 5 mg by mouth every morning., Disp: , Rfl:  .  telmisartan (MICARDIS) 80 MG tablet, Take 80 mg by mouth every morning. , Disp: , Rfl:  .  VITAMIN E PO, Take 400 mg by mouth daily. , Disp: , Rfl:   Allergies:  Allergies  Allergen Reactions  . Sulfonamide Derivatives     REACTION: swelling    Past Medical History, Surgical history,  Social history, and Family History were reviewed and updated.  Review of Systems: As above  Physical Exam:  height is 5\' 4"  (1.626 m) and weight is 192 lb (87.1 kg). Her oral temperature is 98 F (36.7 C). Her blood pressure is 126/91 (abnormal) and her pulse is 75. Her respiration is 18.   Well-developed and well-nourished white female in no obvious distress. Vital signs are temperature of 98.4. Pulse 69. Blood pressure 124/76. Weight is 195 pounds. Head and neck exam shows no ocular or oral lesions. There are no palpable cervical or supraclavicular lymph nodes. Lungs are clear bilaterally. Cardiac exam regular rate and rhythm with no murmurs rubs or bruits. Abdomen is soft. She has good bowel sounds. There is no fluid wave. There is no palpable liver or spleen tip. Back exam no tenderness over the spine ribs or hips. Extremities shows no clubbing cyanosis or edema. Skin exam no rashes, ecchymosis or petechia. Neurological exam is nonfocal.  Lab Results  Component Value Date   WBC 3.1 (L) 10/20/2015   HGB 13.8 10/20/2015   HCT 40.3 10/20/2015   MCV 91 10/20/2015   PLT 306 10/20/2015     Chemistry   No results found for: NA    Component Value Date/Time   ALKPHOS 56 05/11/2008 1625   AST  27 05/11/2008 1625   ALT 36 (H) 05/11/2008 1625   BILITOT 0.8 05/11/2008 1625         Impression and Plan: Jasmine Castro is 60 year old white female. She has autoimmune neutropenia. We have done a bone marrow test on her. This was back in September 2014. This was pretty much unremarkable. She has good myeloid progenitors. Cytogenetics were normal.  As always, her white cell count is stable. It is low. Her she has absolute neutropenia. She has been totally asymptomatic with this.   We will still plan to get her back in 6 months. I did this be reasonable.  I looked at her blood on the microscope and I do not see anything that looked suspicious for an underlying myeloid malignancy.   As always, we  talked about her cooking. She always posts on [place] which she makes on Sunday for Sunday lunch.     Volanda Napoleon, MD 8/24/20179:08 AM

## 2015-12-22 DIAGNOSIS — L820 Inflamed seborrheic keratosis: Secondary | ICD-10-CM | POA: Diagnosis not present

## 2015-12-22 DIAGNOSIS — Z85820 Personal history of malignant melanoma of skin: Secondary | ICD-10-CM | POA: Diagnosis not present

## 2015-12-22 DIAGNOSIS — L570 Actinic keratosis: Secondary | ICD-10-CM | POA: Diagnosis not present

## 2015-12-22 DIAGNOSIS — L812 Freckles: Secondary | ICD-10-CM | POA: Diagnosis not present

## 2015-12-22 DIAGNOSIS — L821 Other seborrheic keratosis: Secondary | ICD-10-CM | POA: Diagnosis not present

## 2015-12-30 MED FILL — TELMISARTAN 80 MG TABLET: 80 | 90 days supply | Qty: 90 | Fill #2

## 2016-01-06 MED FILL — AMLODIPINE BESYLATE 2.5 MG: 2.5 | 90 days supply | Qty: 90 | Fill #1

## 2016-01-06 MED FILL — ROSUVASTATIN CALCIUM 5 MG T: 5 | 90 days supply | Qty: 90 | Fill #1

## 2016-01-14 DIAGNOSIS — W1809XA Striking against other object with subsequent fall, initial encounter: Secondary | ICD-10-CM | POA: Diagnosis not present

## 2016-01-14 DIAGNOSIS — S0081XA Abrasion of other part of head, initial encounter: Secondary | ICD-10-CM | POA: Diagnosis not present

## 2016-01-14 DIAGNOSIS — Z882 Allergy status to sulfonamides status: Secondary | ICD-10-CM | POA: Diagnosis not present

## 2016-01-14 DIAGNOSIS — S0993XA Unspecified injury of face, initial encounter: Secondary | ICD-10-CM | POA: Diagnosis not present

## 2016-01-14 DIAGNOSIS — M25521 Pain in right elbow: Secondary | ICD-10-CM | POA: Diagnosis not present

## 2016-01-14 DIAGNOSIS — M25562 Pain in left knee: Secondary | ICD-10-CM | POA: Diagnosis not present

## 2016-01-14 DIAGNOSIS — S0031XA Abrasion of nose, initial encounter: Secondary | ICD-10-CM | POA: Diagnosis not present

## 2016-01-14 DIAGNOSIS — S52111D Torus fracture of upper end of right radius, subsequent encounter for fracture with routine healing: Secondary | ICD-10-CM | POA: Diagnosis not present

## 2016-01-14 DIAGNOSIS — M25561 Pain in right knee: Secondary | ICD-10-CM | POA: Diagnosis not present

## 2016-01-14 DIAGNOSIS — S80212A Abrasion, left knee, initial encounter: Secondary | ICD-10-CM | POA: Diagnosis not present

## 2016-01-14 DIAGNOSIS — S069X0A Unspecified intracranial injury without loss of consciousness, initial encounter: Secondary | ICD-10-CM | POA: Diagnosis not present

## 2016-01-14 DIAGNOSIS — M170 Bilateral primary osteoarthritis of knee: Secondary | ICD-10-CM | POA: Diagnosis not present

## 2016-01-14 DIAGNOSIS — M25421 Effusion, right elbow: Secondary | ICD-10-CM | POA: Diagnosis not present

## 2016-01-14 DIAGNOSIS — S022XXA Fracture of nasal bones, initial encounter for closed fracture: Secondary | ICD-10-CM | POA: Diagnosis not present

## 2016-01-14 DIAGNOSIS — S8002XA Contusion of left knee, initial encounter: Secondary | ICD-10-CM | POA: Diagnosis not present

## 2016-01-14 DIAGNOSIS — S52121A Displaced fracture of head of right radius, initial encounter for closed fracture: Secondary | ICD-10-CM | POA: Diagnosis not present

## 2016-01-16 DIAGNOSIS — S52111A Torus fracture of upper end of right radius, initial encounter for closed fracture: Secondary | ICD-10-CM | POA: Diagnosis not present

## 2016-01-16 MED FILL — OXYCODONE W/APAP 5/325 TAB: 5-325 | 10 days supply | Qty: 40 | Fill #0

## 2016-01-17 NOTE — H&P (Signed)
Jasmine Castro is an 60 y.o. female.   Chief Complaint: CLOSED COMMINUTED TYPE THREE FRACTURE OF THE PROXIMAL END OF THE RIGHT RADIUS HPI: PATIENT FELL DOWN PATIO STEPS AND LANDED ON ARM ON 11/18. WAS SEEN AT [facility] TO THE OFFICE. PATIENT WAS SEEN IN THE OFFICE ON 11/20. PATIENT IS HERE TODAY FOR SURGERY.   Past Medical History:  Diagnosis Date  . Anemia   . Hyperlipidemia   . Hypertension   . Leukopenia 01/31/2011    Past Surgical History:  Procedure Laterality Date  . TONSILLECTOMY     at age 12  . TUBAL LIGATION  1993    Family History  Problem Relation Age of Onset  . Cancer Mother     breast  . Heart disease Maternal Aunt   . Heart disease Maternal Uncle   . Heart disease Maternal Grandmother   . Heart disease Maternal Grandfather    Social History:  reports that she has never smoked. She has never used smokeless tobacco. She reports that she does not drink alcohol or use drugs.  Allergies:  Allergies  Allergen Reactions  . Sulfonamide Derivatives     REACTION: swelling    No prescriptions prior to admission.    No results found for this or any previous visit (from the past 48 hour(s)). No results found.  ROS NO RECENT ILLNESSES OR HOSPITALIZATIONS  There were no vitals taken for this visit. Physical Exam  General Appearance:  Alert, cooperative, no distress, appears stated age  Head:  Normocephalic, without obvious abnormality, atraumatic  Eyes:  Pupils equal, conjunctiva/corneas clear,         Throat: Lips, mucosa, and tongue normal; teeth and gums normal  Neck: No visible masses     Lungs:   respirations unlabored  Chest Wall:  No tenderness or deformity  Heart:  Regular rate and rhythm,  Abdomen:   Soft, non-tender,         Extremities: RUE: SPLINT IN PLACE ABLE TO EXTEND THUMB AND EXTEND FINGERS FINGERS WARM WELL PERFUSED  Pulses: 2+ and symmetric  Skin: Skin color, texture, turgor normal, no rashes or lesions      Neurologic: Normal    Assessment COMMINUTED TYPE 3 FRACTURE OF THE PROXIMAL END OF THE RIGHT RADIUS  Plan RIGHT ELBOW OPEN REDUCTION AND INTERNAL FIXATION AND ARTHROPLASTY OF THE RADIAL HEAD AS INDICATED.  R/B/A DISCUSSED WITH PT IN OFFICE.  PT VOICED UNDERSTANDING OF PLAN CONSENT SIGNED DAY OF SURGERY PT SEEN AND EXAMINED PRIOR TO OPERATIVE PROCEDURE/DAY OF SURGERY SITE MARKED. QUESTIONS ANSWERED WILL REMAIN AN INPATIENT FOLLOWING SURGERY  WE ARE PLANNING SURGERY FOR YOUR UPPER EXTREMITY. THE RISKS AND BENEFITS OF SURGERY INCLUDE BUT NOT LIMITED TO BLEEDING INFECTION, DAMAGE TO NEARBY NERVES ARTERIES TENDONS, FAILURE OF SURGERY TO ACCOMPLISH ITS INTENDED GOALS, PERSISTENT SYMPTOMS AND NEED FOR FURTHER SURGICAL INTERVENTION. WITH THIS IN MIND WE WILL PROCEED. I HAVE DISCUSSED WITH THE PATIENT THE PRE AND POSTOPERATIVE REGIMEN AND THE DOS AND DON'TS. PT VOICED UNDERSTANDING AND INFORMED CONSENT SIGNED.  Brynda Peon 01/17/2016, 12:31 PM

## 2016-01-18 ENCOUNTER — Encounter (HOSPITAL_COMMUNITY): Payer: Self-pay | Admitting: *Deleted

## 2016-01-18 MED ORDER — CEFAZOLIN SODIUM-DEXTROSE 2-4 GM/100ML-% IV SOLN
2.0000 g | INTRAVENOUS | Status: AC
  Administered 2016-01-20: 2 g via INTRAVENOUS
  Filled 2016-01-18 (×2): qty 100

## 2016-01-18 NOTE — Progress Notes (Signed)
Pt denies SOB, chest pain, and being under the care of a cardiologist. Pt denies having a stress test, echo and cardiac cath. Pt stated that she may have had an EKG and/or chest x ray at PCP's office, Dr. Virgina Jock; records requested. Pt made aware to stop taking  Aspirin, all vitamins, fish oil and herbal medications. Do not take any NSAIDs ie: Ibuprofen, Advil, Naproxen   ( Anaprox), BC and Goody Powder or any medication containing Aspirin. Pt verbalized understanding of all pre-op instructions.

## 2016-01-20 ENCOUNTER — Ambulatory Visit (HOSPITAL_COMMUNITY): Payer: 59 | Admitting: Anesthesiology

## 2016-01-20 ENCOUNTER — Encounter (HOSPITAL_COMMUNITY)
Admission: RE | Disposition: A | Discharge status: Home / Self Care | Payer: Self-pay | Source: Ambulatory Visit | Attending: Orthopedic Surgery

## 2016-01-20 ENCOUNTER — Encounter (HOSPITAL_COMMUNITY): Payer: Self-pay | Admitting: *Deleted

## 2016-01-20 ENCOUNTER — Ambulatory Visit (HOSPITAL_COMMUNITY)
Admission: RE | Admit: 2016-01-20 | Discharge: 2016-01-21 | Disposition: A | Discharge status: Home / Self Care | Payer: 59 | Source: Ambulatory Visit | Attending: Orthopedic Surgery | Admitting: Orthopedic Surgery

## 2016-01-20 DIAGNOSIS — Y9301 Activity, walking, marching and hiking: Secondary | ICD-10-CM | POA: Insufficient documentation

## 2016-01-20 DIAGNOSIS — E785 Hyperlipidemia, unspecified: Secondary | ICD-10-CM | POA: Insufficient documentation

## 2016-01-20 DIAGNOSIS — S52101A Unspecified fracture of upper end of right radius, initial encounter for closed fracture: Secondary | ICD-10-CM

## 2016-01-20 DIAGNOSIS — Z882 Allergy status to sulfonamides status: Secondary | ICD-10-CM | POA: Insufficient documentation

## 2016-01-20 DIAGNOSIS — S52121A Displaced fracture of head of right radius, initial encounter for closed fracture: Secondary | ICD-10-CM | POA: Diagnosis not present

## 2016-01-20 DIAGNOSIS — I10 Essential (primary) hypertension: Secondary | ICD-10-CM | POA: Diagnosis not present

## 2016-01-20 DIAGNOSIS — W108XXA Fall (on) (from) other stairs and steps, initial encounter: Secondary | ICD-10-CM | POA: Diagnosis not present

## 2016-01-20 DIAGNOSIS — Y998 Other external cause status: Secondary | ICD-10-CM | POA: Diagnosis not present

## 2016-01-20 DIAGNOSIS — Y9289 Other specified places as the place of occurrence of the external cause: Secondary | ICD-10-CM | POA: Diagnosis not present

## 2016-01-20 DIAGNOSIS — G8918 Other acute postprocedural pain: Secondary | ICD-10-CM | POA: Diagnosis not present

## 2016-01-20 HISTORY — DX: Other specified postprocedural states: Z98.890

## 2016-01-20 HISTORY — DX: Unspecified fracture of upper end of right radius, initial encounter for closed fracture: S52.101A

## 2016-01-20 HISTORY — PX: ORIF ELBOW FRACTURE: SHX5031

## 2016-01-20 HISTORY — PX: RADIAL HEAD ARTHROPLASTY: SHX6044

## 2016-01-20 HISTORY — DX: Torus fracture of lower end of right radius, initial encounter for closed fracture: S52.521A

## 2016-01-20 HISTORY — DX: Nausea with vomiting, unspecified: R11.2

## 2016-01-20 LAB — CBC
HEMATOCRIT: 35.2 % — AB (ref 36.0–46.0)
Hemoglobin: 11.7 g/dL — ABNORMAL LOW (ref 12.0–15.0)
MCH: 30.2 pg (ref 26.0–34.0)
MCHC: 33.2 g/dL (ref 30.0–36.0)
MCV: 90.7 fL (ref 78.0–100.0)
Platelets: 274 10*3/uL (ref 150–400)
RBC: 3.88 MIL/uL (ref 3.87–5.11)
RDW: 12.4 % (ref 11.5–15.5)
WBC: 2.8 10*3/uL — ABNORMAL LOW (ref 4.0–10.5)

## 2016-01-20 LAB — BASIC METABOLIC PANEL
Anion gap: 9 (ref 5–15)
BUN: 14 mg/dL (ref 6–20)
CALCIUM: 8.9 mg/dL (ref 8.9–10.3)
CO2: 25 mmol/L (ref 22–32)
CREATININE: 0.65 mg/dL (ref 0.44–1.00)
Chloride: 104 mmol/L (ref 101–111)
GFR calc non Af Amer: >60 mL/min (ref >60)
Glucose, Bld: 136 mg/dL — ABNORMAL HIGH (ref 65–99)
Potassium: 4.1 mmol/L (ref 3.5–5.1)
Sodium: 138 mmol/L (ref 135–145)

## 2016-01-20 SURGERY — OPEN REDUCTION INTERNAL FIXATION (ORIF) ELBOW/OLECRANON FRACTURE
Anesthesia: Regional | Site: Elbow | Laterality: Right

## 2016-01-20 MED ORDER — IBUPROFEN 200 MG PO TABS: 200.0000 mg | ORAL_TABLET | Freq: Every day | ORAL | Status: DC | PRN

## 2016-01-20 MED ORDER — VITAMIN C 500 MG PO TABS: 1000.0000 mg | ORAL_TABLET | Freq: Every day | ORAL | Status: DC

## 2016-01-20 MED ORDER — BUPIVACAINE HCL (PF) 0.25 % IJ SOLN
INTRAMUSCULAR | Status: DC | PRN
  Administered 2016-01-20: .09 mL

## 2016-01-20 MED ORDER — AMLODIPINE BESYLATE 2.5 MG PO TABS: 2.5000 mg | ORAL_TABLET | Freq: Every day | ORAL | Status: DC

## 2016-01-20 MED ORDER — HYDROMORPHONE HCL 2 MG/ML IJ SOLN
0.5000 mg | INTRAMUSCULAR | Status: DC | PRN
  Administered 2016-01-20 (×2): 1 mg via INTRAVENOUS
  Filled 2016-01-20 (×2): qty 1

## 2016-01-20 MED ORDER — KCL IN DEXTROSE-NACL 20-5-0.45 MEQ/L-%-% IV SOLN
INTRAVENOUS | Status: DC
  Administered 2016-01-20: 16:00:00 via INTRAVENOUS
  Filled 2016-01-20: qty 1000

## 2016-01-20 MED ORDER — DOCUSATE SODIUM 100 MG PO CAPS
100.0000 mg | ORAL_CAPSULE | Freq: Two times a day (BID) | ORAL | Status: DC
  Administered 2016-01-20: 100 mg via ORAL
  Filled 2016-01-20: qty 1

## 2016-01-20 MED ORDER — DIPHENHYDRAMINE HCL 25 MG PO CAPS: 25.0000 mg | ORAL_CAPSULE | Freq: Four times a day (QID) | ORAL | Status: DC | PRN

## 2016-01-20 MED ORDER — HYDROMORPHONE HCL 2 MG/ML IJ SOLN: 2.0000 mg | INTRAMUSCULAR | Status: DC | PRN

## 2016-01-20 MED ORDER — HYDROMORPHONE HCL 1 MG/ML IJ SOLN
0.2500 mg | INTRAMUSCULAR | Status: DC | PRN
  Administered 2016-01-20: 0.5 mg via INTRAVENOUS

## 2016-01-20 MED ORDER — VITAMIN E 180 MG (400 UNIT) PO CAPS
400.0000 [IU] | ORAL_CAPSULE | Freq: Every day | ORAL | Status: DC
  Filled 2016-01-20 (×2): qty 1

## 2016-01-20 MED ORDER — ONDANSETRON HCL 4 MG/2ML IJ SOLN
INTRAMUSCULAR | Status: DC | PRN
  Administered 2016-01-20: 4 mg via INTRAVENOUS

## 2016-01-20 MED ORDER — PROPOFOL 10 MG/ML IV BOLUS
INTRAVENOUS | Status: AC
  Filled 2016-01-20: qty 20

## 2016-01-20 MED ORDER — EPHEDRINE SULFATE 50 MG/ML IJ SOLN
INTRAMUSCULAR | Status: DC | PRN
  Administered 2016-01-20: 5 mg via INTRAVENOUS

## 2016-01-20 MED ORDER — HYDROMORPHONE HCL 2 MG/ML IJ SOLN
1.0000 mg | INTRAMUSCULAR | Status: DC | PRN
  Administered 2016-01-20 – 2016-01-21 (×2): 2 mg via INTRAVENOUS
  Filled 2016-01-20 (×3): qty 1

## 2016-01-20 MED ORDER — FENTANYL CITRATE (PF) 100 MCG/2ML IJ SOLN
INTRAMUSCULAR | Status: AC
  Administered 2016-01-20: 50 ug
  Filled 2016-01-20: qty 2

## 2016-01-20 MED ORDER — EPINEPHRINE PF 1 MG/ML IJ SOLN
INTRAMUSCULAR | Status: AC
  Filled 2016-01-20: qty 1

## 2016-01-20 MED ORDER — VITAMIN D 1000 UNITS PO TABS
1000.0000 [IU] | ORAL_TABLET | Freq: Every day | ORAL | Status: DC
  Filled 2016-01-20 (×2): qty 1

## 2016-01-20 MED ORDER — SENNOSIDES-DOCUSATE SODIUM 8.6-50 MG PO TABS: 1.0000 | ORAL_TABLET | Freq: Every evening | ORAL | Status: DC | PRN

## 2016-01-20 MED ORDER — BUPIVACAINE HCL (PF) 0.25 % IJ SOLN
INTRAMUSCULAR | Status: AC
  Filled 2016-01-20: qty 30

## 2016-01-20 MED ORDER — FENTANYL CITRATE (PF) 100 MCG/2ML IJ SOLN
INTRAMUSCULAR | Status: DC | PRN
  Administered 2016-01-20 (×2): 25 ug via INTRAVENOUS

## 2016-01-20 MED ORDER — ONDANSETRON HCL 4 MG PO TABS: 4.0000 mg | ORAL_TABLET | Freq: Four times a day (QID) | ORAL | Status: DC | PRN

## 2016-01-20 MED ORDER — PHENYLEPHRINE HCL 10 MG/ML IJ SOLN
INTRAMUSCULAR | Status: DC | PRN
  Administered 2016-01-20 (×3): 80 ug via INTRAVENOUS

## 2016-01-20 MED ORDER — ROSUVASTATIN CALCIUM 5 MG PO TABS: 5.0000 mg | ORAL_TABLET | Freq: Every morning | ORAL | Status: DC

## 2016-01-20 MED ORDER — ALPRAZOLAM 0.5 MG PO TABS: 0.5000 mg | ORAL_TABLET | Freq: Four times a day (QID) | ORAL | Status: DC | PRN

## 2016-01-20 MED ORDER — STERILE WATER FOR INJECTION IJ SOLN
INTRAMUSCULAR | Status: AC
  Filled 2016-01-20: qty 20

## 2016-01-20 MED ORDER — OXYCODONE-ACETAMINOPHEN 5-325 MG PO TABS: 1.0000 | ORAL_TABLET | ORAL | 0 refills | Status: DC | PRN

## 2016-01-20 MED ORDER — OXYCODONE-ACETAMINOPHEN 5-325 MG PO TABS: 1.0000 | ORAL_TABLET | ORAL | Status: DC | PRN

## 2016-01-20 MED ORDER — FENTANYL CITRATE (PF) 100 MCG/2ML IJ SOLN
INTRAMUSCULAR | Status: AC
Start: 2016-01-20 — End: 2016-01-20
  Filled 2016-01-20: qty 2

## 2016-01-20 MED ORDER — PROMETHAZINE HCL 25 MG/ML IJ SOLN
12.5000 mg | Freq: Four times a day (QID) | INTRAMUSCULAR | Status: DC | PRN
Start: 2016-01-20 — End: 2016-01-21
  Administered 2016-01-20: 12.5 mg via INTRAVENOUS
  Filled 2016-01-20: qty 1

## 2016-01-20 MED ORDER — PROPOFOL 10 MG/ML IV BOLUS
INTRAVENOUS | Status: DC | PRN
  Administered 2016-01-20: 200 mg via INTRAVENOUS

## 2016-01-20 MED ORDER — CHLORHEXIDINE GLUCONATE 4 % EX LIQD: 60.0000 mL | Freq: Once | CUTANEOUS | Status: DC

## 2016-01-20 MED ORDER — LACTATED RINGERS IV SOLN
INTRAVENOUS | Status: DC
  Administered 2016-01-20 (×2): via INTRAVENOUS

## 2016-01-20 MED ORDER — METHOCARBAMOL 500 MG PO TABS: 500.0000 mg | ORAL_TABLET | Freq: Four times a day (QID) | ORAL | 0 refills | Status: DC

## 2016-01-20 MED ORDER — CEFUROXIME SODIUM 1.5 G IJ SOLR
INTRAMUSCULAR | Status: AC
  Filled 2016-01-20: qty 1.5

## 2016-01-20 MED ORDER — CALCIUM CARBONATE-VITAMIN D 500-200 MG-UNIT PO TABS
1.0000 | ORAL_TABLET | Freq: Every day | ORAL | Status: DC
  Filled 2016-01-20 (×2): qty 1

## 2016-01-20 MED ORDER — 0.9 % SODIUM CHLORIDE (POUR BTL) OPTIME
TOPICAL | Status: DC | PRN
  Administered 2016-01-20: 1000 mL

## 2016-01-20 MED ORDER — METHOCARBAMOL 1000 MG/10ML IJ SOLN
500.0000 mg | Freq: Four times a day (QID) | INTRAVENOUS | Status: DC | PRN
  Filled 2016-01-20 (×2): qty 5

## 2016-01-20 MED ORDER — METHOCARBAMOL 500 MG PO TABS: 500.0000 mg | ORAL_TABLET | Freq: Four times a day (QID) | ORAL | Status: DC | PRN

## 2016-01-20 MED ORDER — CEFAZOLIN IN D5W 1 GM/50ML IV SOLN: 1.0000 g | INTRAVENOUS | Status: DC

## 2016-01-20 MED ORDER — SCOPOLAMINE 1 MG/3DAYS TD PT72
MEDICATED_PATCH | TRANSDERMAL | Status: AC
Start: 2016-01-20 — End: 2016-01-20
  Administered 2016-01-20: 09:00:00
  Filled 2016-01-20: qty 1

## 2016-01-20 MED ORDER — ONDANSETRON HCL 4 MG/2ML IJ SOLN
4.0000 mg | Freq: Four times a day (QID) | INTRAMUSCULAR | Status: DC | PRN
  Administered 2016-01-20: 4 mg via INTRAVENOUS
  Filled 2016-01-20: qty 2

## 2016-01-20 MED ORDER — OXYCODONE-ACETAMINOPHEN 5-325 MG PO TABS: 1.0000 | ORAL_TABLET | Freq: Four times a day (QID) | ORAL | Status: DC | PRN

## 2016-01-20 MED ORDER — IRBESARTAN 300 MG PO TABS: 300.0000 mg | ORAL_TABLET | Freq: Every day | ORAL | Status: DC

## 2016-01-20 MED ORDER — VITAMIN C 500 MG PO TABS: 500.0000 mg | ORAL_TABLET | Freq: Every day | ORAL | 0 refills | Status: AC

## 2016-01-20 MED ORDER — DOCUSATE SODIUM 100 MG PO CAPS: 100.0000 mg | ORAL_CAPSULE | Freq: Two times a day (BID) | ORAL | 0 refills | Status: DC

## 2016-01-20 MED ORDER — LIDOCAINE HCL (CARDIAC) 20 MG/ML IV SOLN
INTRAVENOUS | Status: DC | PRN
  Administered 2016-01-20: 100 mg via INTRAVENOUS

## 2016-01-20 MED ORDER — MIDAZOLAM HCL 2 MG/2ML IJ SOLN
INTRAMUSCULAR | Status: AC
  Filled 2016-01-20: qty 2

## 2016-01-20 MED ORDER — HYDROCODONE-ACETAMINOPHEN 5-325 MG PO TABS
1.0000 | ORAL_TABLET | ORAL | Status: DC | PRN
  Administered 2016-01-20 – 2016-01-21 (×4): 2 via ORAL
  Filled 2016-01-20 (×4): qty 2

## 2016-01-20 MED ORDER — HYDROMORPHONE HCL 1 MG/ML IJ SOLN
INTRAMUSCULAR | Status: AC
  Filled 2016-01-20: qty 0.5

## 2016-01-20 MED ORDER — CEFAZOLIN IN D5W 1 GM/50ML IV SOLN
1.0000 g | Freq: Three times a day (TID) | INTRAVENOUS | Status: DC
  Administered 2016-01-20 – 2016-01-21 (×3): 1 g via INTRAVENOUS
  Filled 2016-01-20 (×5): qty 50

## 2016-01-20 MED ORDER — MIDAZOLAM HCL 2 MG/2ML IJ SOLN
INTRAMUSCULAR | Status: AC
  Administered 2016-01-20: 1 mg
  Filled 2016-01-20: qty 2

## 2016-01-20 SURGICAL SUPPLY — 69 items
BANDAGE ACE 4X5 VEL STRL LF (GAUZE/BANDAGES/DRESSINGS) ×2 IMPLANT
BANDAGE ELASTIC 3 VELCRO ST LF (GAUZE/BANDAGES/DRESSINGS) ×1 IMPLANT
BANDAGE ELASTIC 4 VELCRO ST LF (GAUZE/BANDAGES/DRESSINGS) ×2 IMPLANT
BLADE LONG MED 31X9 (MISCELLANEOUS) ×2 IMPLANT
BNDG CMPR 9X4 STRL LF SNTH (GAUZE/BANDAGES/DRESSINGS) ×1
BNDG COHESIVE 4X5 TAN STRL (GAUZE/BANDAGES/DRESSINGS) ×2 IMPLANT
BNDG ESMARK 4X9 LF (GAUZE/BANDAGES/DRESSINGS) ×2 IMPLANT
BNDG GAUZE ELAST 4 BULKY (GAUZE/BANDAGES/DRESSINGS) ×4 IMPLANT
CORDS BIPOLAR (ELECTRODE) ×2 IMPLANT
COVER SURGICAL LIGHT HANDLE (MISCELLANEOUS) ×2 IMPLANT
CUFF TOURNIQUET SINGLE 24IN (TOURNIQUET CUFF) ×1 IMPLANT
DECANTER SPIKE VIAL GLASS SM (MISCELLANEOUS) ×1 IMPLANT
DRAPE INCISE IOBAN 66X45 STRL (DRAPES) ×2 IMPLANT
DRAPE OEC MINIVIEW 54X84 (DRAPES) ×1 IMPLANT
DRAPE ORTHO SPLIT 77X108 STRL (DRAPES) ×4
DRAPE SURG 17X11 SM STRL (DRAPES) ×2 IMPLANT
DRAPE SURG ORHT 6 SPLT 77X108 (DRAPES) ×2 IMPLANT
DRAPE U-SHAPE 47X51 STRL (DRAPES) ×2 IMPLANT
DRSG ADAPTIC 3X8 NADH LF (GAUZE/BANDAGES/DRESSINGS) ×2 IMPLANT
ELECT REM PT RETURN 9FT ADLT (ELECTROSURGICAL) ×2
ELECTRODE REM PT RTRN 9FT ADLT (ELECTROSURGICAL) ×1 IMPLANT
GAUZE SPONGE 4X4 12PLY STRL (GAUZE/BANDAGES/DRESSINGS) ×2 IMPLANT
GAUZE XEROFORM 5X9 LF (GAUZE/BANDAGES/DRESSINGS) ×1 IMPLANT
GLOVE BIOGEL PI IND STRL 6.5 (GLOVE) IMPLANT
GLOVE BIOGEL PI IND STRL 8.5 (GLOVE) ×1 IMPLANT
GLOVE BIOGEL PI INDICATOR 6.5 (GLOVE) ×1
GLOVE BIOGEL PI INDICATOR 8.5 (GLOVE) ×1
GLOVE SURG ORTHO 8.0 STRL STRW (GLOVE) ×2 IMPLANT
GLOVE SURG SS PI 6.5 STRL IVOR (GLOVE) ×1 IMPLANT
GOWN STRL REUS W/ TWL LRG LVL3 (GOWN DISPOSABLE) ×2 IMPLANT
GOWN STRL REUS W/ TWL XL LVL3 (GOWN DISPOSABLE) ×1 IMPLANT
GOWN STRL REUS W/TWL LRG LVL3 (GOWN DISPOSABLE) ×4
GOWN STRL REUS W/TWL XL LVL3 (GOWN DISPOSABLE) ×2
IMPL HEAD (Orthopedic Implant) IMPLANT
IMPL STEM W/SCREW 7X26MM (Stem) IMPLANT
IMPLANT HEAD (Orthopedic Implant) ×2 IMPLANT
IMPLANT STEM W/SCREW 7X26MM (Stem) ×2 IMPLANT
KIT BASIN OR (CUSTOM PROCEDURE TRAY) ×2 IMPLANT
KIT ROOM TURNOVER OR (KITS) ×2 IMPLANT
MANIFOLD NEPTUNE II (INSTRUMENTS) ×2 IMPLANT
NDL HYPO 25GX1X1/2 BEV (NEEDLE) ×1 IMPLANT
NEEDLE HYPO 25GX1X1/2 BEV (NEEDLE) ×2 IMPLANT
NS IRRIG 1000ML POUR BTL (IV SOLUTION) ×2 IMPLANT
PACK ORTHO EXTREMITY (CUSTOM PROCEDURE TRAY) ×2 IMPLANT
PAD ARMBOARD 7.5X6 YLW CONV (MISCELLANEOUS) ×4 IMPLANT
PAD CAST 4YDX4 CTTN HI CHSV (CAST SUPPLIES) ×2 IMPLANT
PADDING CAST COTTON 4X4 STRL (CAST SUPPLIES) ×4
SOAP 2 % CHG 4 OZ (WOUND CARE) ×2 IMPLANT
SPLINT FIBERGLASS 3X35 (CAST SUPPLIES) ×1 IMPLANT
SPONGE LAP 4X18 X RAY DECT (DISPOSABLE) ×2 IMPLANT
STRIP CLOSURE SKIN 1/2X4 (GAUZE/BANDAGES/DRESSINGS) ×1 IMPLANT
SUCTION FRAZIER HANDLE 10FR (MISCELLANEOUS) ×1
SUCTION TUBE FRAZIER 10FR DISP (MISCELLANEOUS) IMPLANT
SUT MERSILENE 4 0 P 3 (SUTURE) IMPLANT
SUT MNCRL AB 3-0 PS2 18 (SUTURE) ×1 IMPLANT
SUT PROLENE 3 0 PS 2 (SUTURE) IMPLANT
SUT PROLENE 4 0 PS 2 18 (SUTURE) ×1 IMPLANT
SUT VIC AB 2-0 CT1 27 (SUTURE)
SUT VIC AB 2-0 CT1 TAPERPNT 27 (SUTURE) IMPLANT
SUT VIC AB 2-0 FS1 27 (SUTURE) ×2 IMPLANT
SUT VICRYL 0 CT 1 36IN (SUTURE) IMPLANT
SUT VICRYL 4-0 PS2 18IN ABS (SUTURE) IMPLANT
SYR CONTROL 10ML LL (SYRINGE) ×2 IMPLANT
TOWEL OR 17X24 6PK STRL BLUE (TOWEL DISPOSABLE) ×2 IMPLANT
TOWEL OR 17X26 10 PK STRL BLUE (TOWEL DISPOSABLE) ×3 IMPLANT
TRAY FOLEY CATH 16FRSI W/METER (SET/KITS/TRAYS/PACK) IMPLANT
TUBE CONNECTING 12X1/4 (SUCTIONS) ×2 IMPLANT
UNDERPAD 30X30 (UNDERPADS AND DIAPERS) ×2 IMPLANT
WATER STERILE IRR 1000ML POUR (IV SOLUTION) ×2 IMPLANT

## 2016-01-20 NOTE — Progress Notes (Signed)
Orthopedic Tech Progress Note Patient Details:  Jasmine Castro 21-Jan-1956 [id number]  Ortho Devices Type of Ortho Device: Arm sling Ortho Device/Splint Location: Kuzma sling Ortho Device/Splint Interventions: Application   [place] Pink 01/20/2016, 3:13 PM

## 2016-01-20 NOTE — Op Note (Signed)
Jasmine Castro, Jasmine Castro                ACCOUNT NO.:  [id number]  MEDICAL RECORD NO.:  [id number]  LOCATION:  PERIO                        FACILITY:  [facility]  PHYSICIAN:  Linna Hoff IV, M.D.DATE OF BIRTH:  [birth year]  DATE OF PROCEDURE:  01/20/2016 DATE OF DISCHARGE:                              OPERATIVE REPORT   PREOPERATIVE DIAGNOSIS:  Right elbow proximal radius type 3 radial head fracture, 3 or 4 more fragments.  POSTOPERATIVE DIAGNOSIS:  Right elbow proximal radius type 3 radial head fracture, 3 or 4 more fragments.  ATTENDING PHYSICIAN:  Linna Hoff, M.D. who scrubbed and present for the entire procedure.  ASSISTANT SURGEON:  Gertie Fey, PA-C, who scrubbed and necessary for the entire procedure helped during exposure, placement of the implants, reduction, closure, and application of the splint.  SURGICAL PROCEDURE: 1. Open treatment of right elbow radial head fracture with radial head     arthroplasty. 2. Radiographs 3 views, right elbow.  SURGICAL IMPLANTS:  Biomet radial head system with a 7-mm stem, 22 mm x 10 mm head.  RADIOGRAPHIC INTERPRETATION:  AP, lateral, and oblique views of the elbow did show the right hand arthroplasty in place.  There was good preservation of the joint interval with relatively good position of the radial head implant.  SURGICAL INDICATION:  [name] is a right-hand-dominant female who sustained a comminuted type 3 radial head fracture.  The patient was seen and evaluated in the office and recommended to undergo the above procedure.  Risks, benefits, and alternatives were discussed in detail with the patient and signed informed consent was obtained.  Risks include, but not limited to bleeding, infection, damage to nearby nerves, arteries, or tendons; loss of motion of wrist, digits, and forearm; elbow mobility and need for further surgical intervention.  DESCRIPTION OF PROCEDURE:  The patient was properly identified in  the preoperative holding area, marked with a permanent marker made on the right elbow to indicate correct operative site.  The patient was then brought back to the operating room, placed supine on the anesthesia room table where general anesthetic was administered.  The patient tolerated this well.  A well-padded tourniquet was placed on the right brachium and sealed with 1000 drape.  Right upper extremity was then prepped and draped in normal sterile fashion.  Time-out was called, correct site was identified, and procedure begun.  Attention was then turned to the right elbow.  A curvilinear incision was made over the posterolateral aspect of the elbow.  Dissection was carried down through skin and subcutaneous tissue.  The extensor aponeurosis was incised longitudinally exposing the extensor interval which was then opened up and the joint capsulotomy was then carried out.  The fracture hematoma was then evacuated.  This was a type 3 radial head fracture.  Several loose fragments of the radial head were then evacuated, this was greater than four-part fracture of the radial head.  Once this was carried out, the radial head was then removed in a piecemeal fashion removing the large fragments. This was not amenable to open reduction and internal fixation.  After radial head removal, a careful blunt dissection was then carried along the radial shaft keeping  the forearm in a pronated position.  The radial neck was then exposed and completion of the osteotomy was then carried out of the radial neck.  Once this was carried out,  trial broaching was then carried out beginning from 5 mm going up to 7 mm, felt to be adequate, 7-mm trial stem was then placed.  The head was then placed and trial reduction was then carried out with a modular radial head.  The appropriate head size was then chosen.  The planer was then used to plane down the radial head __________ nicely.  Once this was carried out,  the final implants were then chosen.  The 7-mm stem was then placed without any complicating feature.  The radial head was then trialed once again and then the final radial head was then placed, 22 x 10 mm head. The wound was then thoroughly irrigated.  Final radiographs were then obtained.  The extensor aponeurosis closed with 2-0 Vicryl, subcutaneous tissues closed with Monocryl, and the skin closed with a running 4-0 subcuticular.  Steri-Strips were applied.  Xeroform dressing was applied over the wound site.  Sterile compressive bandage applied.  The patient was then placed in a well-padded, long-arm splint, extubated, and taken to recovery room in good condition.  POSTPROCEDURE PLAN:  The patient will be admitted overnight for IV antibiotics and pain control, discharged in the morning, see me back in the office in approximately 12-14 days for wound check, suture removal, x-rays, 3 views of the elbow __________ postoperative radial head protocol.  Radiographs at each visit.     [name], M.D.     FWO/MEDQ  D:  01/20/2016  T:  01/20/2016  Job:  [id number]

## 2016-01-20 NOTE — Anesthesia Procedure Notes (Signed)
Anesthesia Regional Block:  Supraclavicular block  Pre-Anesthetic Checklist: ,, timeout performed, Correct Patient, Correct Site, Correct Laterality, Correct Procedure, Correct Position, site marked, Risks and benefits discussed, pre-op evaluation,  At surgeon's request and post-op pain management  Laterality: Right  Prep: chloraprep       Needles:   Needle Type: Echogenic Needle     Needle Length: 9cm 9 cm Needle Gauge: 21 and 21 G    Additional Needles:  Procedures: ultrasound guided (picture in chart) Supraclavicular block Narrative:  Injection made incrementally with aspirations every 5 mL. Anesthesiologist: Lyndle Herrlich  Additional Notes: 20 cc .5% Marcaine 5cc2% Xylocaine

## 2016-01-20 NOTE — Anesthesia Postprocedure Evaluation (Signed)
Anesthesia Post Note  Patient: Jasmine Castro  Procedure(s) Performed: Procedure(s) (LRB): OPEN REDUCTION INTERNAL FIXATION (ORIF) RIGHT ELBOW/OLECRANON FRACTURE (Right) RIGHT RADIAL HEAD ARTHROPLASTY (Right)  Patient location during evaluation: PACU Anesthesia Type: General Level of consciousness: sedated Pain management: satisfactory to patient Vital Signs Assessment: post-procedure vital signs reviewed and stable Respiratory status: spontaneous breathing Cardiovascular status: stable Anesthetic complications: no    Last Vitals:  Vitals:   01/20/16 1145 01/20/16 1154  BP:    Pulse: 72   Resp: 18   Temp:  36.7 C    Last Pain:  Vitals:   01/20/16 1154  TempSrc:   PainSc: 4                  Deauna Yaw EDWARD

## 2016-01-20 NOTE — Anesthesia Procedure Notes (Signed)
Procedure Name: LMA Insertion Date/Time: 01/20/2016 9:35 AM Performed by: Shirlyn Goltz Pre-anesthesia Checklist: Emergency Drugs available, Patient identified, Suction available and Patient being monitored Patient Re-evaluated:Patient Re-evaluated prior to inductionOxygen Delivery Method: Circle system utilized Preoxygenation: Pre-oxygenation with 100% oxygen Intubation Type: IV induction Ventilation: Mask ventilation without difficulty LMA: LMA flexible inserted LMA Size: 4.0 Number of attempts: 1 Placement Confirmation: positive ETCO2 and breath sounds checked- equal and bilateral Tube secured with: Tape Dental Injury: Teeth and Oropharynx as per pre-operative assessment

## 2016-01-20 NOTE — Care Management Note (Signed)
Case Management Note  Patient Details  Name: Jasmine Castro MRN: [id number] Date of Birth: Mar 01, 1955  Subjective/Objective:   ORIF right elbow radial head fracture with radial head arthroplasty             Action/Plan: Discharge Planning: NCM spoke to pt and lives at home with husband. He will there to assist with her care. Pt states she will need note for work. Message left for attending.    Expected Discharge Date:  01/21/2016              Expected Discharge Plan:  Home/Self Care  In-House Referral:  NA  Discharge planning Services  CM Consult  Post Acute Care Choice:  NA Choice offered to:  NA  DME Arranged:  N/A DME Agency:  NA  HH Arranged:  NA HH Agency:  NA  Status of Service:  Completed, signed off  If discussed at [place] of Stay Meetings, dates discussed:    Additional Comments:  Erenest Rasher, RN 01/20/2016, 12:22 PM

## 2016-01-20 NOTE — Transfer of Care (Signed)
Immediate Anesthesia Transfer of Care Note  Patient: Jasmine Castro  Procedure(s) Performed: Procedure(s): OPEN REDUCTION INTERNAL FIXATION (ORIF) RIGHT ELBOW/OLECRANON FRACTURE (Right) RIGHT RADIAL HEAD ARTHROPLASTY (Right)  Patient Location: PACU  Anesthesia Type:General and Regional  Level of Consciousness: drowsy   Airway & Oxygen Therapy: Patient Spontanous Breathing and Patient connected to nasal cannula oxygen  Post-op Assessment: Report given to RN and Post -op Vital signs reviewed and stable  Post vital signs: Reviewed and stable  Last Vitals:  Vitals:   01/20/16 0903 01/20/16 0904  BP: 137/69   Pulse: 91 88  Resp: (!) 23 (!) 25  Temp:      Last Pain:  Vitals:   01/20/16 0808  TempSrc: Oral  PainSc:          Complications: No apparent anesthesia complications

## 2016-01-20 NOTE — Discharge Instructions (Signed)
KEEP BANDAGE CLEAN AND DRY CALL OFFICE FOR F/U APPT [phone] in 12 days ALSO CALL FOR THERAPY APPT [phone] EXT 1601 FOR THERAPY APPT SAME DAY DR Caralyn Guile CELL [phone] KEEP HAND ELEVATED ABOVE HEART OK TO APPLY ICE TO OPERATIVE AREA CONTACT OFFICE IF ANY WORSENING PAIN OR CONCERNS.

## 2016-01-20 NOTE — Anesthesia Preprocedure Evaluation (Signed)
Anesthesia Evaluation  Patient identified by MRN, date of birth, ID band Patient awake    Reviewed: Allergy & Precautions, H&P , Patient's Chart, lab work & pertinent test results, reviewed documented beta blocker date and time   Airway Mallampati: II  TM Distance: >3 FB Neck ROM: full    Dental no notable dental hx.    Pulmonary    Pulmonary exam normal breath sounds clear to auscultation       Cardiovascular hypertension, On Medications  Rhythm:regular Rate:Normal     Neuro/Psych    GI/Hepatic   Endo/Other    Renal/GU      Musculoskeletal   Abdominal   Peds  Hematology   Anesthesia Other Findings   Reproductive/Obstetrics                             Anesthesia Physical Anesthesia Plan  ASA: II  Anesthesia Plan:    Post-op Pain Management: GA combined w/ Regional for post-op pain   Induction: Intravenous  Airway Management Planned: LMA  Additional Equipment:   Intra-op Plan:   Post-operative Plan:   Informed Consent: I have reviewed the patients History and Physical, chart, labs and discussed the procedure including the risks, benefits and alternatives for the proposed anesthesia with the patient or authorized representative who has indicated his/her understanding and acceptance.   Dental Advisory Given and Dental advisory given  Plan Discussed with: CRNA and Surgeon  Anesthesia Plan Comments: (Discussed GA with LMA, possible sore throat, potential need to switch to ETT, N/V, pulmonary aspiration. Questions answered. )        Anesthesia Quick Evaluation

## 2016-01-21 DIAGNOSIS — I10 Essential (primary) hypertension: Secondary | ICD-10-CM | POA: Diagnosis not present

## 2016-01-21 DIAGNOSIS — Z882 Allergy status to sulfonamides status: Secondary | ICD-10-CM | POA: Diagnosis not present

## 2016-01-21 DIAGNOSIS — E785 Hyperlipidemia, unspecified: Secondary | ICD-10-CM | POA: Diagnosis not present

## 2016-01-21 DIAGNOSIS — S52121A Displaced fracture of head of right radius, initial encounter for closed fracture: Secondary | ICD-10-CM | POA: Diagnosis not present

## 2016-01-21 NOTE — Discharge Summary (Signed)
Physician Discharge Summary  Patient ID: Jasmine Castro MRN: [id number] DOB/AGE: [birth year] 58 y.o.  Admit date: 01/20/2016 Discharge date: 01/21/2016  Admission Diagnoses: closed torus fracture of proximal end of the right radius Past Medical History:  Diagnosis Date  . Anemia   . Hyperlipidemia   . Hypertension   . Leukopenia 01/31/2011  . PONV (postoperative nausea and vomiting)   . Torus fracture of distal end of right radius     Discharge Diagnoses:  Active Problems:   Closed fracture of right proximal radius   Surgeries: Procedure(s): OPEN REDUCTION INTERNAL FIXATION (ORIF) RIGHT ELBOW/OLECRANON FRACTURE RIGHT RADIAL HEAD ARTHROPLASTY on 01/20/2016    Consultants:   Discharged Condition: Improved  Hospital Course: Jasmine Castro is an 60 y.o. female who was admitted 01/20/2016 with a chief complaint of No chief complaint on file. , and found to have a diagnosis of closed torus fracture of proximal end of the right radius.  They were brought to the operating room on 01/20/2016 and underwent Procedure(s): OPEN REDUCTION INTERNAL FIXATION (ORIF) RIGHT ELBOW/OLECRANON FRACTURE RIGHT RADIAL HEAD ARTHROPLASTY.    They were given perioperative antibiotics: Anti-infectives    Start     Dose/Rate Route Frequency Ordered Stop   01/20/16 1300  ceFAZolin (ANCEF) IVPB 1 g/50 mL premix     1 g 100 mL/hr over 30 Minutes Intravenous Every 8 hours 01/20/16 1229     01/20/16 1230  ceFAZolin (ANCEF) IVPB 1 g/50 mL premix  Status:  Discontinued     1 g 100 mL/hr over 30 Minutes Intravenous NOW 01/20/16 1229 01/20/16 1241   01/20/16 0930  ceFAZolin (ANCEF) IVPB 2g/100 mL premix     2 g 200 mL/hr over 30 Minutes Intravenous To ShortStay [facility] 01/18/16 0735 01/20/16 0940    .  They were given sequential compression devices, early ambulation, and Other (comment)AMBULATION for DVT prophylaxis.  Recent vital signs: Patient Vitals for the past 24 hrs:  BP Temp Temp src Pulse Resp  SpO2  01/21/16 0458 (!) 119/56 98.5 F (36.9 C) Oral 71 18 96 %  01/21/16 0020 115/60 99.7 F (37.6 C) Oral 74 18 98 %  01/20/16 2021 (!) 110/58 98.9 F (37.2 C) Oral 91 16 90 %  01/20/16 1218 120/65 97.7 F (36.5 C) Oral 60 - 95 %  01/20/16 1154 - 98 F (36.7 C) - - - -  01/20/16 1145 - - - 72 18 99 %  01/20/16 1138 120/68 - - 70 18 98 %  01/20/16 1130 - - - 81 15 97 %  01/20/16 1123 111/61 - - 73 19 96 %  01/20/16 1115 - - - 97 17 94 %  01/20/16 1108 (!) 93/50 97.5 F (36.4 C) - 64 19 97 %  .  Recent laboratory studies: No results found.  Discharge Medications:     Medication List    TAKE these medications   amLODipine 2.5 MG tablet Commonly known as:  NORVASC Take 2.5 mg by mouth daily.   CALCIUM-VITAMIN D PO Take 1 tablet by mouth daily.   cholecalciferol 1000 units tablet Commonly known as:  VITAMIN D Take 1,000 Units by mouth daily.   docusate sodium 100 MG capsule Commonly known as:  COLACE Take 1 capsule (100 mg total) by mouth 2 (two) times daily.   ibuprofen 200 MG tablet Commonly known as:  ADVIL,MOTRIN Take 200 mg by mouth daily as needed for headache or moderate pain.   methocarbamol 500 MG tablet Commonly known as:  ROBAXIN Take 1 tablet (500 mg total) by mouth 4 (four) times daily.   multivitamin capsule Take 1 capsule by mouth daily.   naproxen sodium 220 MG tablet Commonly known as:  ANAPROX Take 220 mg by mouth daily as needed (pain).   oxyCODONE-acetaminophen 5-325 MG tablet Commonly known as:  PERCOCET/ROXICET Take 1 tablet by mouth every 6 (six) hours as needed for severe pain. What changed:  Another medication with the same name was added. Make sure you understand how and when to take each.   oxyCODONE-acetaminophen 5-325 MG tablet Commonly known as:  ROXICET Take 1-2 tablets by mouth every 4 (four) hours as needed for severe pain. What changed:  You were already taking a medication with the same name, and this prescription was  added. Make sure you understand how and when to take each.   rosuvastatin 5 MG tablet Commonly known as:  CRESTOR Take 5 mg by mouth every morning.   telmisartan 80 MG tablet Commonly known as:  MICARDIS Take 80 mg by mouth every morning.   vitamin C 1000 MG tablet Take 1,000 mg by mouth daily. What changed:  Another medication with the same name was added. Make sure you understand how and when to take each.   vitamin C 500 MG tablet Commonly known as:  ASCORBIC ACID Take 1 tablet (500 mg total) by mouth daily. What changed:  You were already taking a medication with the same name, and this prescription was added. Make sure you understand how and when to take each.   vitamin E 400 UNIT capsule Take 400 Units by mouth daily.       Diagnostic Studies: No results found.  They benefited maximally from their hospital stay and there were no complications.     Disposition: Final discharge disposition not confirmed  Follow-up Osgood, MD.   Specialty:  Orthopedic Surgery Contact information: [address] [place] [address] [id number]            Signed: Linna Hoff 01/21/2016, 9:31 AM

## 2016-01-23 ENCOUNTER — Encounter (HOSPITAL_COMMUNITY): Payer: Self-pay | Admitting: Orthopedic Surgery

## 2016-02-03 DIAGNOSIS — S52111D Torus fracture of upper end of right radius, subsequent encounter for fracture with routine healing: Secondary | ICD-10-CM | POA: Diagnosis not present

## 2016-02-07 DIAGNOSIS — S52111D Torus fracture of upper end of right radius, subsequent encounter for fracture with routine healing: Secondary | ICD-10-CM | POA: Diagnosis not present

## 2016-02-09 DIAGNOSIS — S52111D Torus fracture of upper end of right radius, subsequent encounter for fracture with routine healing: Secondary | ICD-10-CM | POA: Diagnosis not present

## 2016-02-14 DIAGNOSIS — S52111D Torus fracture of upper end of right radius, subsequent encounter for fracture with routine healing: Secondary | ICD-10-CM | POA: Diagnosis not present

## 2016-02-16 DIAGNOSIS — S52111D Torus fracture of upper end of right radius, subsequent encounter for fracture with routine healing: Secondary | ICD-10-CM | POA: Diagnosis not present

## 2016-02-21 DIAGNOSIS — S52111D Torus fracture of upper end of right radius, subsequent encounter for fracture with routine healing: Secondary | ICD-10-CM | POA: Diagnosis not present

## 2016-02-23 DIAGNOSIS — S52111D Torus fracture of upper end of right radius, subsequent encounter for fracture with routine healing: Secondary | ICD-10-CM | POA: Diagnosis not present

## 2016-02-28 DIAGNOSIS — S52111D Torus fracture of upper end of right radius, subsequent encounter for fracture with routine healing: Secondary | ICD-10-CM | POA: Diagnosis not present

## 2016-03-01 DIAGNOSIS — M859 Disorder of bone density and structure, unspecified: Secondary | ICD-10-CM | POA: Diagnosis not present

## 2016-03-01 DIAGNOSIS — R7309 Other abnormal glucose: Secondary | ICD-10-CM | POA: Diagnosis not present

## 2016-03-01 DIAGNOSIS — I10 Essential (primary) hypertension: Secondary | ICD-10-CM | POA: Diagnosis not present

## 2016-03-01 DIAGNOSIS — S52111D Torus fracture of upper end of right radius, subsequent encounter for fracture with routine healing: Secondary | ICD-10-CM | POA: Diagnosis not present

## 2016-03-06 DIAGNOSIS — S52111D Torus fracture of upper end of right radius, subsequent encounter for fracture with routine healing: Secondary | ICD-10-CM | POA: Diagnosis not present

## 2016-03-08 DIAGNOSIS — S52111D Torus fracture of upper end of right radius, subsequent encounter for fracture with routine healing: Secondary | ICD-10-CM | POA: Diagnosis not present

## 2016-03-08 DIAGNOSIS — M25521 Pain in right elbow: Secondary | ICD-10-CM | POA: Insufficient documentation

## 2016-03-08 DIAGNOSIS — N209 Urinary calculus, unspecified: Secondary | ICD-10-CM | POA: Diagnosis not present

## 2016-03-08 DIAGNOSIS — E784 Other hyperlipidemia: Secondary | ICD-10-CM | POA: Diagnosis not present

## 2016-03-08 DIAGNOSIS — R7309 Other abnormal glucose: Secondary | ICD-10-CM | POA: Diagnosis not present

## 2016-03-08 DIAGNOSIS — Z0000 Encounter for general adult medical examination without abnormal findings: Secondary | ICD-10-CM | POA: Diagnosis not present

## 2016-03-08 DIAGNOSIS — R002 Palpitations: Secondary | ICD-10-CM | POA: Diagnosis not present

## 2016-03-08 DIAGNOSIS — Z1389 Encounter for screening for other disorder: Secondary | ICD-10-CM | POA: Diagnosis not present

## 2016-03-08 DIAGNOSIS — E668 Other obesity: Secondary | ICD-10-CM | POA: Diagnosis not present

## 2016-03-08 DIAGNOSIS — Z23 Encounter for immunization: Secondary | ICD-10-CM | POA: Diagnosis not present

## 2016-03-08 DIAGNOSIS — I10 Essential (primary) hypertension: Secondary | ICD-10-CM | POA: Diagnosis not present

## 2016-03-08 DIAGNOSIS — D72819 Decreased white blood cell count, unspecified: Secondary | ICD-10-CM | POA: Diagnosis not present

## 2016-03-12 DIAGNOSIS — S52111D Torus fracture of upper end of right radius, subsequent encounter for fracture with routine healing: Secondary | ICD-10-CM | POA: Diagnosis not present

## 2016-03-12 DIAGNOSIS — Z4789 Encounter for other orthopedic aftercare: Secondary | ICD-10-CM | POA: Diagnosis not present

## 2016-03-13 DIAGNOSIS — S52111D Torus fracture of upper end of right radius, subsequent encounter for fracture with routine healing: Secondary | ICD-10-CM | POA: Diagnosis not present

## 2016-03-15 DIAGNOSIS — S52111D Torus fracture of upper end of right radius, subsequent encounter for fracture with routine healing: Secondary | ICD-10-CM | POA: Diagnosis not present

## 2016-03-22 DIAGNOSIS — S52111D Torus fracture of upper end of right radius, subsequent encounter for fracture with routine healing: Secondary | ICD-10-CM | POA: Diagnosis not present

## 2016-03-27 DIAGNOSIS — S52111D Torus fracture of upper end of right radius, subsequent encounter for fracture with routine healing: Secondary | ICD-10-CM | POA: Diagnosis not present

## 2016-03-28 DIAGNOSIS — Z130 Encounter for screening for diseases of the blood and blood-forming organs and certain disorders involving the immune mechanism: Secondary | ICD-10-CM | POA: Diagnosis not present

## 2016-03-28 DIAGNOSIS — R8290 Unspecified abnormal findings in urine: Secondary | ICD-10-CM | POA: Diagnosis not present

## 2016-03-28 DIAGNOSIS — Z01419 Encounter for gynecological examination (general) (routine) without abnormal findings: Secondary | ICD-10-CM | POA: Diagnosis not present

## 2016-03-28 DIAGNOSIS — Z124 Encounter for screening for malignant neoplasm of cervix: Secondary | ICD-10-CM | POA: Diagnosis not present

## 2016-03-28 MED FILL — TELMISARTAN 80 MG TABLET: 80 | 90 days supply | Qty: 90 | Fill #3

## 2016-03-29 DIAGNOSIS — Z1212 Encounter for screening for malignant neoplasm of rectum: Secondary | ICD-10-CM | POA: Diagnosis not present

## 2016-03-29 DIAGNOSIS — S52111D Torus fracture of upper end of right radius, subsequent encounter for fracture with routine healing: Secondary | ICD-10-CM | POA: Diagnosis not present

## 2016-04-03 DIAGNOSIS — S52111D Torus fracture of upper end of right radius, subsequent encounter for fracture with routine healing: Secondary | ICD-10-CM | POA: Diagnosis not present

## 2016-04-05 DIAGNOSIS — S52111D Torus fracture of upper end of right radius, subsequent encounter for fracture with routine healing: Secondary | ICD-10-CM | POA: Diagnosis not present

## 2016-04-10 DIAGNOSIS — S52111D Torus fracture of upper end of right radius, subsequent encounter for fracture with routine healing: Secondary | ICD-10-CM | POA: Diagnosis not present

## 2016-04-12 DIAGNOSIS — S52111D Torus fracture of upper end of right radius, subsequent encounter for fracture with routine healing: Secondary | ICD-10-CM | POA: Diagnosis not present

## 2016-04-13 MED FILL — ROSUVASTATIN CALCIUM 5 MG T: 5 | 90 days supply | Qty: 90 | Fill #2

## 2016-04-13 MED FILL — AMLODIPINE BESYLATE 2.5 MG: 2.5 | 90 days supply | Qty: 90 | Fill #2

## 2016-04-17 DIAGNOSIS — S52111D Torus fracture of upper end of right radius, subsequent encounter for fracture with routine healing: Secondary | ICD-10-CM | POA: Diagnosis not present

## 2016-04-19 DIAGNOSIS — S52111D Torus fracture of upper end of right radius, subsequent encounter for fracture with routine healing: Secondary | ICD-10-CM | POA: Diagnosis not present

## 2016-04-23 ENCOUNTER — Ambulatory Visit (HOSPITAL_BASED_OUTPATIENT_CLINIC_OR_DEPARTMENT_OTHER): Payer: 59 | Admitting: Hematology & Oncology

## 2016-04-23 ENCOUNTER — Other Ambulatory Visit (HOSPITAL_BASED_OUTPATIENT_CLINIC_OR_DEPARTMENT_OTHER): Payer: 59

## 2016-04-23 VITALS — BP 158/82 | HR 82 | Temp 98.2°F | Resp 16 | Wt 199.0 lb

## 2016-04-23 DIAGNOSIS — D708 Other neutropenia: Secondary | ICD-10-CM | POA: Diagnosis not present

## 2016-04-23 DIAGNOSIS — D72819 Decreased white blood cell count, unspecified: Secondary | ICD-10-CM

## 2016-04-23 LAB — CBC WITH DIFFERENTIAL (CANCER CENTER ONLY)
BASO#: 0 10*3/uL (ref 0.0–0.2)
BASO%: 0.6 % (ref 0.0–2.0)
EOS ABS: 0.2 10*3/uL (ref 0.0–0.5)
EOS%: 5 % (ref 0.0–7.0)
HEMATOCRIT: 39.5 % (ref 34.8–46.6)
HEMOGLOBIN: 13.6 g/dL (ref 11.6–15.9)
LYMPH#: 1.6 10*3/uL (ref 0.9–3.3)
LYMPH%: 49.8 % — ABNORMAL HIGH (ref 14.0–48.0)
MCH: 31.1 pg (ref 26.0–34.0)
MCHC: 34.4 g/dL (ref 32.0–36.0)
MCV: 90 fL (ref 81–101)
MONO#: 0.8 10*3/uL (ref 0.1–0.9)
MONO%: 23.2 % — ABNORMAL HIGH (ref 0.0–13.0)
NEUT%: 21.4 % — ABNORMAL LOW (ref 39.6–80.0)
NEUTROS ABS: 0.7 10*3/uL — AB (ref 1.5–6.5)
Platelets: 293 10*3/uL (ref 145–400)
RBC: 4.37 10*6/uL (ref 3.70–5.32)
RDW: 12.1 % (ref 11.1–15.7)
WBC: 3.2 10*3/uL — AB (ref 3.9–10.0)

## 2016-04-23 LAB — CHCC SATELLITE - SMEAR

## 2016-04-23 NOTE — Progress Notes (Signed)
Hematology and Oncology Follow Up Visit  Jasmine Castro [id number] 1955/09/09 61 y.o. 04/23/2016   Principle Diagnosis:   Autoimmune neutropenia  Current Therapy:    Observation     Interim History:  Ms.  Castro is back for followup. Shockley enough, she had a serious accident before Thanksgiving. She was at her daughter's house and fell. She shattered her right elbow. She needed surgery for this. This was a very complicated surgery. She still is not able to use her right arm all that much. She is still taking physical therapy. She's not able to straighten out her right arm.  She also broke her nose. She also hurt her left knee.  She just started going back to work in February part-time. She really needed the time off. She was thankful for the time off. She feels that this accident happened because God wanted her to slow down.  She had the Norovirus last week. She got over this in about 3 or 4 days.  Thankfully, she had no problem with infections from the surgery that she had for her elbow.  Unfortunately, because of this injury, she not been able to make Sunday lunch which she would always post on Facebook. Since my wife went to high school with her college my wife when he was no wish she was having for Sunday lunch.  Currently, her overall, her performance status is ECOG 1.  Medications:  Current Outpatient Prescriptions:  .  amLODipine (NORVASC) 2.5 MG tablet, Take 2.5 mg by mouth daily., Disp: , Rfl: 11 .  Ascorbic Acid (VITAMIN C) 1000 MG tablet, Take 1,000 mg by mouth daily., Disp: , Rfl:  .  CALCIUM-VITAMIN D PO, Take 1 tablet by mouth daily., Disp: , Rfl:  .  cholecalciferol (VITAMIN D) 1000 UNITS tablet, Take 1,000 Units by mouth daily., Disp: , Rfl:  .  docusate sodium (COLACE) 100 MG capsule, Take 1 capsule (100 mg total) by mouth 2 (two) times daily., Disp: 10 capsule, Rfl: 0 .  ibuprofen (ADVIL,MOTRIN) 200 MG tablet, Take 200 mg by mouth daily as needed for headache or  moderate pain., Disp: , Rfl:  .  methocarbamol (ROBAXIN) 500 MG tablet, Take 1 tablet (500 mg total) by mouth 4 (four) times daily., Disp: 30 tablet, Rfl: 0 .  Multiple Vitamin (MULTIVITAMIN) capsule, Take 1 capsule by mouth daily., Disp: , Rfl:  .  naproxen sodium (ANAPROX) 220 MG tablet, Take 220 mg by mouth daily as needed (pain)., Disp: , Rfl:  .  oxyCODONE-acetaminophen (PERCOCET/ROXICET) 5-325 MG tablet, Take 1 tablet by mouth every 6 (six) hours as needed for severe pain., Disp: , Rfl:  .  oxyCODONE-acetaminophen (ROXICET) 5-325 MG tablet, Take 1-2 tablets by mouth every 4 (four) hours as needed for severe pain., Disp: 40 tablet, Rfl: 0 .  rosuvastatin (CRESTOR) 5 MG tablet, Take 5 mg by mouth every morning., Disp: , Rfl:  .  telmisartan (MICARDIS) 80 MG tablet, Take 80 mg by mouth every morning. , Disp: , Rfl:  .  vitamin C (ASCORBIC ACID) 500 MG tablet, Take 1 tablet (500 mg total) by mouth daily., Disp: 30 tablet, Rfl: 0 .  vitamin E 400 UNIT capsule, Take 400 Units by mouth daily., Disp: , Rfl:   Allergies:  Allergies  Allergen Reactions  . Sulfonamide Derivatives Swelling    SWELLING REACTION UNSPECIFIED     Past Medical History, Surgical history, Social history, and Family History were reviewed and updated.  Review of Systems: As above  Physical Exam:  weight is 199 lb (90.3 kg). Her oral temperature is 98.2 F (36.8 C). Her blood pressure is 158/82 (abnormal) and her pulse is 82. Her respiration is 16 and oxygen saturation is 97%.   Well-developed and well-nourished white female in no obvious distress. Vital signs are temperature of 98.4. Pulse 69. Blood pressure 124/76. Weight is 195 pounds. Head and neck exam shows no ocular or oral lesions. There are no palpable cervical or supraclavicular lymph nodes. Lungs are clear bilaterally. Cardiac exam regular rate and rhythm with no murmurs rubs or bruits. Abdomen is soft. She has good bowel sounds. There is no fluid wave. There  is no palpable liver or spleen tip. Back exam no tenderness over the spine ribs or hips. Extremities shows no clubbing cyanosis or edema. She has a healed surgical scar in her right elbow. Right elbow is swollen. There is moderate limited range of motion of the right elbow. Skin exam no rashes, ecchymosis or petechia. Neurological exam is nonfocal.  Lab Results  Component Value Date   WBC 3.2 (L) 04/23/2016   HGB 13.6 04/23/2016   HCT 39.5 04/23/2016   MCV 90 04/23/2016   PLT 293 04/23/2016     Chemistry      Component Value Date/Time   NA 138 01/20/2016 0806      Component Value Date/Time   CALCIUM 8.9 01/20/2016 0806   ALKPHOS 56 05/11/2008 1625   AST 27 05/11/2008 1625   ALT 36 (H) 05/11/2008 1625   BILITOT 0.8 05/11/2008 1625         Impression and Plan: Ms. Knudtson is 61 year old white female. She has autoimmune neutropenia. We have done a bone marrow test on her. This was back in September 2014. This was pretty much unremarkable. She has good myeloid progenitors. Cytogenetics were normal.  In reality, she gave herself a "stress test". I would think that if she did was at a risk for infection, she would've had it with respect to her surgeries and accident back in November.  I think the Norovirus that she had a week or so ago was just one of those infectious etiologies that had nothing to do with her neutropenia.   I know that she will lose the weight when she is able to do more activities. She is still doing physical therapy for the right arm.  I think we can get her back in 6 months. I think this would be very reasonable.   Jasmine Napoleon, MD 2/26/20189:30 AM

## 2016-04-25 DIAGNOSIS — S52111D Torus fracture of upper end of right radius, subsequent encounter for fracture with routine healing: Secondary | ICD-10-CM | POA: Diagnosis not present

## 2016-05-01 DIAGNOSIS — S52111D Torus fracture of upper end of right radius, subsequent encounter for fracture with routine healing: Secondary | ICD-10-CM | POA: Diagnosis not present

## 2016-05-08 DIAGNOSIS — H1045 Other chronic allergic conjunctivitis: Secondary | ICD-10-CM | POA: Diagnosis not present

## 2016-05-08 DIAGNOSIS — H0530 Unspecified deformity of orbit: Secondary | ICD-10-CM | POA: Diagnosis not present

## 2016-05-10 DIAGNOSIS — S52111D Torus fracture of upper end of right radius, subsequent encounter for fracture with routine healing: Secondary | ICD-10-CM | POA: Diagnosis not present

## 2016-05-15 DIAGNOSIS — S52111D Torus fracture of upper end of right radius, subsequent encounter for fracture with routine healing: Secondary | ICD-10-CM | POA: Diagnosis not present

## 2016-05-15 DIAGNOSIS — Z4789 Encounter for other orthopedic aftercare: Secondary | ICD-10-CM | POA: Diagnosis not present

## 2016-05-18 ENCOUNTER — Other Ambulatory Visit: Payer: Self-pay | Admitting: Obstetrics and Gynecology

## 2016-05-18 DIAGNOSIS — Z1231 Encounter for screening mammogram for malignant neoplasm of breast: Secondary | ICD-10-CM

## 2016-06-11 DIAGNOSIS — H40053 Ocular hypertension, bilateral: Secondary | ICD-10-CM | POA: Diagnosis not present

## 2016-06-12 ENCOUNTER — Ambulatory Visit
Admission: RE | Admit: 2016-06-12 | Discharge: 2016-06-12 | Disposition: A | Discharge status: Home / Self Care | Payer: 59 | Source: Ambulatory Visit | Attending: Obstetrics and Gynecology | Admitting: Obstetrics and Gynecology

## 2016-06-12 DIAGNOSIS — Z1231 Encounter for screening mammogram for malignant neoplasm of breast: Secondary | ICD-10-CM

## 2016-06-21 DIAGNOSIS — Z85820 Personal history of malignant melanoma of skin: Secondary | ICD-10-CM | POA: Diagnosis not present

## 2016-06-21 DIAGNOSIS — D225 Melanocytic nevi of trunk: Secondary | ICD-10-CM | POA: Diagnosis not present

## 2016-06-21 DIAGNOSIS — L821 Other seborrheic keratosis: Secondary | ICD-10-CM | POA: Diagnosis not present

## 2016-06-21 DIAGNOSIS — L814 Other melanin hyperpigmentation: Secondary | ICD-10-CM | POA: Diagnosis not present

## 2016-06-26 DIAGNOSIS — S52111D Torus fracture of upper end of right radius, subsequent encounter for fracture with routine healing: Secondary | ICD-10-CM | POA: Diagnosis not present

## 2016-06-26 DIAGNOSIS — Z4789 Encounter for other orthopedic aftercare: Secondary | ICD-10-CM | POA: Diagnosis not present

## 2016-07-02 MED FILL — TELMISARTAN 80 MG TABLET: 80 | 90 days supply | Qty: 90 | Fill #0

## 2016-07-02 MED FILL — ROSUVASTATIN CALCIUM 5 MG T: 5 | 90 days supply | Qty: 90 | Fill #0

## 2016-07-30 MED FILL — AMLODIPINE BESYLATE 2.5 MG: 2.5 | 90 days supply | Qty: 90 | Fill #3

## 2016-08-20 DIAGNOSIS — E668 Other obesity: Secondary | ICD-10-CM | POA: Diagnosis not present

## 2016-08-20 DIAGNOSIS — D72819 Decreased white blood cell count, unspecified: Secondary | ICD-10-CM | POA: Diagnosis not present

## 2016-08-20 DIAGNOSIS — E119 Type 2 diabetes mellitus without complications: Secondary | ICD-10-CM | POA: Diagnosis not present

## 2016-08-20 DIAGNOSIS — I10 Essential (primary) hypertension: Secondary | ICD-10-CM | POA: Diagnosis not present

## 2016-08-20 DIAGNOSIS — Z6835 Body mass index (BMI) 35.0-35.9, adult: Secondary | ICD-10-CM | POA: Diagnosis not present

## 2016-08-20 DIAGNOSIS — M25521 Pain in right elbow: Secondary | ICD-10-CM | POA: Diagnosis not present

## 2016-08-20 MED FILL — metFORMIN HCL 500 MG TABS: 500 | 90 days supply | Qty: 90 | Fill #0

## 2016-10-01 MED FILL — TELMISARTAN 80 MG TABLET: 80 | 90 days supply | Qty: 90 | Fill #1

## 2016-10-01 MED FILL — ROSUVASTATIN CALCIUM 5 MG T: 5 | 90 days supply | Qty: 90 | Fill #1

## 2016-10-24 ENCOUNTER — Other Ambulatory Visit (HOSPITAL_BASED_OUTPATIENT_CLINIC_OR_DEPARTMENT_OTHER): Payer: 59

## 2016-10-24 ENCOUNTER — Ambulatory Visit (HOSPITAL_BASED_OUTPATIENT_CLINIC_OR_DEPARTMENT_OTHER): Payer: 59 | Admitting: Hematology & Oncology

## 2016-10-24 VITALS — BP 149/92 | HR 71 | Temp 97.7°F | Resp 16 | Wt 193.0 lb

## 2016-10-24 DIAGNOSIS — D708 Other neutropenia: Secondary | ICD-10-CM

## 2016-10-24 LAB — CBC WITH DIFFERENTIAL (CANCER CENTER ONLY)
BASO#: 0 10*3/uL (ref 0.0–0.2)
BASO%: 0.8 % (ref 0.0–2.0)
EOS ABS: 0.1 10*3/uL (ref 0.0–0.5)
EOS%: 3.5 % (ref 0.0–7.0)
HCT: 40.5 % (ref 34.8–46.6)
HEMOGLOBIN: 13.8 g/dL (ref 11.6–15.9)
LYMPH#: 1.3 10*3/uL (ref 0.9–3.3)
LYMPH%: 50.6 % — AB (ref 14.0–48.0)
MCH: 31 pg (ref 26.0–34.0)
MCHC: 34.1 g/dL (ref 32.0–36.0)
MCV: 91 fL (ref 81–101)
MONO#: 0.6 10*3/uL (ref 0.1–0.9)
MONO%: 23.1 % — AB (ref 0.0–13.0)
NEUT#: 0.6 10*3/uL — ABNORMAL LOW (ref 1.5–6.5)
NEUT%: 22 % — ABNORMAL LOW (ref 39.6–80.0)
Platelets: 306 10*3/uL (ref 145–400)
RBC: 4.45 10*6/uL (ref 3.70–5.32)
RDW: 12 % (ref 11.1–15.7)
WBC: 2.6 10*3/uL — ABNORMAL LOW (ref 3.9–10.0)

## 2016-10-24 LAB — CHCC SATELLITE - SMEAR

## 2016-10-24 NOTE — Progress Notes (Signed)
Hematology and Oncology Follow Up Visit  Jasmine Castro [id number] [birth year] 61 y.o. 10/24/2016   Principle Diagnosis:   Autoimmune neutropenia  Current Therapy:    Observation     Interim History:  Jasmine Castro is back for followup. We saw her back in February. Since then, his been very busy for she and her husband. Her husband's mother passed away 3 weeks ago. Thankfully, she was fairly healthy up until she passed.  Jasmine Castro is taking care of her own parents. She is an only child. They are becoming more elderly and frail.  She still has recovery to do from her fall last November. She fell and fractured her right elbow. She had surgery for this. She had no problems with infections despite her neutropenia. She still has limited range of motion. She understands this will be her "new normal."  She's had no fever. She's had no rashes. She's had no cough or shortness of breath. His been no change in bowel or bladder habits.  There's been no leg swelling. She's had no headache.  Overall, her performance status is ECOG 0.   Medications:  Current Outpatient Prescriptions:  .  amLODipine (NORVASC) 2.5 MG tablet, Take 2.5 mg by mouth daily., Disp: , Rfl: 11 .  Ascorbic Acid (VITAMIN C) 1000 MG tablet, Take 1,000 mg by mouth daily., Disp: , Rfl:  .  CALCIUM-VITAMIN D PO, Take 1 tablet by mouth daily., Disp: , Rfl:  .  cholecalciferol (VITAMIN D) 1000 UNITS tablet, Take 1,000 Units by mouth daily., Disp: , Rfl:  .  docusate sodium (COLACE) 100 MG capsule, Take 1 capsule (100 mg total) by mouth 2 (two) times daily., Disp: 10 capsule, Rfl: 0 .  ibuprofen (ADVIL,MOTRIN) 200 MG tablet, Take 200 mg by mouth daily as needed for headache or moderate pain., Disp: , Rfl:  .  methocarbamol (ROBAXIN) 500 MG tablet, Take 1 tablet (500 mg total) by mouth 4 (four) times daily., Disp: 30 tablet, Rfl: 0 .  Multiple Vitamin (MULTIVITAMIN) capsule, Take 1 capsule by mouth daily., Disp: , Rfl:  .  naproxen  sodium (ANAPROX) 220 MG tablet, Take 220 mg by mouth daily as needed (pain)., Disp: , Rfl:  .  oxyCODONE-acetaminophen (PERCOCET/ROXICET) 5-325 MG tablet, Take 1 tablet by mouth every 6 (six) hours as needed for severe pain., Disp: , Rfl:  .  oxyCODONE-acetaminophen (ROXICET) 5-325 MG tablet, Take 1-2 tablets by mouth every 4 (four) hours as needed for severe pain., Disp: 40 tablet, Rfl: 0 .  rosuvastatin (CRESTOR) 5 MG tablet, Take 5 mg by mouth every morning., Disp: , Rfl:  .  telmisartan (MICARDIS) 80 MG tablet, Take 80 mg by mouth every morning. , Disp: , Rfl:  .  vitamin C (ASCORBIC ACID) 500 MG tablet, Take 1 tablet (500 mg total) by mouth daily., Disp: 30 tablet, Rfl: 0 .  vitamin E 400 UNIT capsule, Take 400 Units by mouth daily., Disp: , Rfl:   Allergies:  Allergies  Allergen Reactions  . Sulfonamide Derivatives Swelling    SWELLING REACTION UNSPECIFIED     Past Medical History, Surgical history, Social history, and Family History were reviewed and updated.  Review of System:   As stated in the interim history  Physical Exam: Physical Exam  Constitutional: She is oriented to person, place, and time.  HENT:  Head: Normocephalic and atraumatic.  Mouth/Throat: Oropharynx is clear and moist.  Eyes: Pupils are equal, round, and reactive to light. EOM are normal.  Neck:  Normal range of motion.  Cardiovascular: Normal rate, regular rhythm and normal heart sounds.   Pulmonary/Chest: Effort normal and breath sounds normal.  Abdominal: Soft. Bowel sounds are normal.  Musculoskeletal: Normal range of motion. She exhibits no edema, tenderness or deformity.  Lymphadenopathy:    She has no cervical adenopathy.  Neurological: She is alert and oriented to person, place, and time.  Skin: Skin is warm and dry. No rash noted. No erythema.  Psychiatric: She has a normal mood and affect. Her behavior is normal. Judgment and thought content normal.  Vitals reviewed.    Lab Results   Component Value Date   WBC 2.6 (L) 10/24/2016   HGB 13.8 10/24/2016   HCT 40.5 10/24/2016   MCV 91 10/24/2016   PLT 306 10/24/2016     Chemistry      Component Value Date/Time   NA 138 01/20/2016 0806      Component Value Date/Time   CALCIUM 8.9 01/20/2016 0806   ALKPHOS 56 05/11/2008 1625   AST 27 05/11/2008 1625   ALT 36 (H) 05/11/2008 1625   BILITOT 0.8 05/11/2008 1625         Impression and Plan: Jasmine Castro is 61 year old white female. She has chronic neutropenia. We have been following her now for   6 years. She has done well. She's never had issues with infections.  I reviewed her blood smear. I do not see anything on the blood smear that looked suspicious. I saw no issues with respect to any hematologic malignancy. I did not see any evidence of obvious myelodysplasia.   We will continue to follow her along every 6 months. I think this would be reasonable.   We will be more than happy to see her back sooner if she does have any issues.    we will definitely pray hard for her and her family.   Volanda Napoleon, MD 8/29/20189:44 AM

## 2016-10-30 MED FILL — AMLODIPINE BESYLATE 2.5 MG: 2.5 | 90 days supply | Qty: 90 | Fill #0

## 2016-11-13 DIAGNOSIS — E119 Type 2 diabetes mellitus without complications: Secondary | ICD-10-CM | POA: Diagnosis not present

## 2016-11-13 DIAGNOSIS — I10 Essential (primary) hypertension: Secondary | ICD-10-CM | POA: Diagnosis not present

## 2016-11-13 DIAGNOSIS — Z6834 Body mass index (BMI) 34.0-34.9, adult: Secondary | ICD-10-CM | POA: Diagnosis not present

## 2016-11-13 DIAGNOSIS — D72819 Decreased white blood cell count, unspecified: Secondary | ICD-10-CM | POA: Diagnosis not present

## 2016-11-13 DIAGNOSIS — E668 Other obesity: Secondary | ICD-10-CM | POA: Diagnosis not present

## 2016-11-17 ENCOUNTER — Encounter (HOSPITAL_COMMUNITY): Payer: Self-pay | Admitting: Emergency Medicine

## 2016-11-17 ENCOUNTER — Emergency Department (HOSPITAL_COMMUNITY)
Admission: EM | Admit: 2016-11-17 | Discharge: 2016-11-17 | Disposition: A | Discharge status: Home / Self Care | Payer: 59 | Attending: Emergency Medicine | Admitting: Emergency Medicine

## 2016-11-17 DIAGNOSIS — Y929 Unspecified place or not applicable: Secondary | ICD-10-CM | POA: Insufficient documentation

## 2016-11-17 DIAGNOSIS — W25XXXA Contact with sharp glass, initial encounter: Secondary | ICD-10-CM | POA: Insufficient documentation

## 2016-11-17 DIAGNOSIS — S61217A Laceration without foreign body of left little finger without damage to nail, initial encounter: Secondary | ICD-10-CM | POA: Insufficient documentation

## 2016-11-17 DIAGNOSIS — Y999 Unspecified external cause status: Secondary | ICD-10-CM | POA: Insufficient documentation

## 2016-11-17 DIAGNOSIS — Y939 Activity, unspecified: Secondary | ICD-10-CM | POA: Diagnosis not present

## 2016-11-17 DIAGNOSIS — Z7984 Long term (current) use of oral hypoglycemic drugs: Secondary | ICD-10-CM | POA: Insufficient documentation

## 2016-11-17 DIAGNOSIS — Z79899 Other long term (current) drug therapy: Secondary | ICD-10-CM | POA: Insufficient documentation

## 2016-11-17 DIAGNOSIS — I10 Essential (primary) hypertension: Secondary | ICD-10-CM | POA: Insufficient documentation

## 2016-11-17 MED ORDER — CEPHALEXIN 500 MG PO CAPS: 500.0000 mg | ORAL_CAPSULE | Freq: Two times a day (BID) | ORAL | 0 refills | Status: DC

## 2016-11-17 MED ORDER — LIDOCAINE HCL 2 % IJ SOLN: 20.0000 mL | Freq: Once | INTRAMUSCULAR | Status: DC

## 2016-11-17 MED ORDER — CEPHALEXIN 500 MG PO CAPS
500.0000 mg | ORAL_CAPSULE | Freq: Once | ORAL | Status: AC
  Administered 2016-11-17: 500 mg via ORAL
  Filled 2016-11-17: qty 1

## 2016-11-17 MED ORDER — LIDOCAINE HCL (PF) 2 % IJ SOLN
20.0000 mL | Freq: Once | INTRAMUSCULAR | Status: AC
  Administered 2016-11-17: 20 mL
  Filled 2016-11-17: qty 20

## 2016-11-17 MED ORDER — BACITRACIN ZINC 500 UNIT/GM EX OINT
1.0000 "application " | TOPICAL_OINTMENT | Freq: Once | CUTANEOUS | Status: AC
  Administered 2016-11-17: 1 via TOPICAL

## 2016-11-17 MED ORDER — LIDOCAINE-EPINEPHRINE-TETRACAINE (LET) SOLUTION
3.0000 mL | Freq: Once | NASAL | Status: AC
  Administered 2016-11-17: 3 mL via TOPICAL
  Filled 2016-11-17: qty 3

## 2016-11-17 MED ORDER — LIDOCAINE HCL (PF) 2 % IJ SOLN: 20.0000 mL | Freq: Once | INTRAMUSCULAR | Status: DC

## 2016-11-17 NOTE — ED Notes (Signed)
Bed: WTR5 Expected date:  Expected time:  Means of arrival:  Comments: 

## 2016-11-17 NOTE — Discharge Instructions (Signed)
Keep wound dry and do not remove dressing for 24 hours if possible. After that, wash gently morning and night (every 12 hours) with soap and water. Use a topical antibiotic ointment and cover with a bandaid or gauze.  °  °Do NOT use rubbing alcohol or hydrogen peroxide, do not soak the area °  °Present to your primary care doctor or the urgent care of your choice, or the ED for suture removal in 7-8 days. °  °Every attempt was made to remove foreign body (contaminants) from the wound.  However, there is always a chance that some may remain in the wound. This can  increase your risk of infection. °  °If you see signs of infection (warmth, redness, tenderness, pus, sharp increase in pain, fever, red streaking in the skin) immediately return to the emergency department. °  °After the wound heals fully, apply sunscreen for 6-12 months to minimize scarring.  ° °

## 2016-11-17 NOTE — ED Triage Notes (Signed)
Patient c/o approximately one inch laceration to left fifth finger after dropping glass dish this morning. Bleeding controlled. Movement and sensation to finger.

## 2016-11-17 NOTE — ED Provider Notes (Signed)
[facility] DEPT Provider Note   CSN: [id number] Arrival date & time: 11/17/16  1107     History   Chief Complaint Chief Complaint  Patient presents with  . Laceration    HPI   Blood pressure (!) 161/98, pulse 91, temperature 98.2 F (36.8 C), temperature source Oral, resp. rate 18, SpO2 95 %.  Jasmine Castro is a 61 y.o. female complaining of laceration to left small digit sustained this a.m. after she dropped a pyrex glass. Patient is right-hand-dominant, tetanus shot is up-to-date. She was seen at urgent care and advised  to present to the ED for possible nerve damage. She denies any decreased range of motion, decreased sensation. She's not anticoagulated and bleeding has not been difficult to control.  Past Medical History:  Diagnosis Date  . Anemia   . Hyperlipidemia   . Hypertension   . Leukopenia 01/31/2011  . PONV (postoperative nausea and vomiting)   . Torus fracture of distal end of right radius     Patient Active Problem List   Diagnosis Date Noted  . Closed fracture of right proximal radius 01/20/2016  . Obesity 04/28/2014  . Abnormal weight gain 04/28/2014  . Essential hypertension, benign 04/28/2014  . Hypercholesterolemia 04/28/2014  . Mastodynia 03/20/2011  . Leukopenia 01/31/2011  . ABDOMINAL PAIN-RUQ 05/11/2008  . Hypertensive disorder 02/26/1998    Past Surgical History:  Procedure Laterality Date  . COLONOSCOPY    . ORIF ELBOW FRACTURE Right 01/20/2016   Procedure: OPEN REDUCTION INTERNAL FIXATION (ORIF) RIGHT ELBOW/OLECRANON FRACTURE;  Surgeon: Iran Planas, MD;  Location: [place];  Service: Orthopedics;  Laterality: Right;  . RADIAL HEAD ARTHROPLASTY Right 01/20/2016   Procedure: RIGHT RADIAL HEAD ARTHROPLASTY;  Surgeon: Iran Planas, MD;  Location: [facility];  Service: Orthopedics;  Laterality: Right;  . TONSILLECTOMY     at age 23  . TUBAL LIGATION  1993    OB History    No data available       Home Medications    Prior to Admission  medications   Medication Sig Start Date End Date Taking? Authorizing Provider  amLODipine (NORVASC) 2.5 MG tablet Take 2.5 mg by mouth daily. 01/06/16   [provider]  CALCIUM-VITAMIN D PO Take 1 tablet by mouth daily.    [provider]  cephALEXin (KEFLEX) 500 MG capsule Take 1 capsule (500 mg total) by mouth 2 (two) times daily. 11/17/16   Alsie Younes, Elmyra Ricks, PA-C  cholecalciferol (VITAMIN D) 1000 UNITS tablet Take 1,000 Units by mouth daily.    [provider]  cycloSPORINE (RESTASIS) 0.05 % ophthalmic emulsion Restasis MultiDose 0.05 % eye drops    [provider]  docusate sodium (COLACE) 100 MG capsule Take 1 capsule (100 mg total) by mouth 2 (two) times daily. 01/20/16   Brynda Peon, PA  ibuprofen (ADVIL,MOTRIN) 200 MG tablet Take 200 mg by mouth daily as needed for headache or moderate pain.    [provider]  Loteprednol Etabonate (LOTEMAX) 0.5 % GEL Lotemax 0.5 % eye gel drops    [provider]  metFORMIN (GLUCOPHAGE) 500 MG tablet Take 250 mg by mouth daily. 08/20/16   [provider]  methocarbamol (ROBAXIN) 500 MG tablet Take 1 tablet (500 mg total) by mouth 4 (four) times daily. 01/20/16   Brynda Peon, PA  Multiple Vitamin (MULTIVITAMIN) capsule Take 1 capsule by mouth daily.    [provider]  naproxen sodium (ANAPROX) 220 MG tablet Take 220 mg by mouth daily  as needed (pain).    [provider]  rosuvastatin (CRESTOR) 5 MG tablet Take 5 mg by mouth every morning.    [provider]  sodium fluoride (PREVIDENT) 1.1 % GEL dental gel PreviDent 5000 Booster Plus 1.1 % dental paste  USE ONCE A DAY    [provider]  telmisartan (MICARDIS) 80 MG tablet Take 80 mg by mouth every morning.     [provider]  vitamin C (ASCORBIC ACID) 500 MG tablet Take 1 tablet (500 mg total) by mouth daily. 01/20/16   Brynda Peon, PA  vitamin E 400 UNIT  capsule Take 400 Units by mouth daily.    [provider]    Family History Family History  Problem Relation Age of Onset  . Cancer Mother        breast  . Breast cancer Mother   . Heart disease Maternal Aunt   . Heart disease Maternal Uncle   . Heart disease Maternal Grandmother   . Heart disease Maternal Grandfather     Social History Social History  Substance Use Topics  . Smoking status: Never Smoker  . Smokeless tobacco: Never Used     Comment: never used product  . Alcohol use No     Allergies   Sulfa antibiotics and Sulfonamide derivatives   Review of Systems Review of Systems  A complete review of systems was obtained and all systems are negative except as noted in the HPI and PMH.    Physical Exam Updated Vital Signs BP (!) 161/98 (BP Location: Right Arm)   Pulse 91   Temp 98.2 F (36.8 C) (Oral)   Resp 18   SpO2 95%   Physical Exam  Constitutional: She is oriented to person, place, and time. She appears well-developed and well-nourished. No distress.  HENT:  Head: Normocephalic and atraumatic.  Mouth/Throat: Oropharynx is clear and moist.  Eyes: Pupils are equal, round, and reactive to light. Conjunctivae and EOM are normal.  Neck: Normal range of motion.  Cardiovascular: Normal rate, regular rhythm and intact distal pulses.   Pulmonary/Chest: Effort normal and breath sounds normal.  Abdominal: Soft. There is no tenderness.  Musculoskeletal: Normal range of motion.  Neurological: She is alert and oriented to person, place, and time.  Skin: She is not diaphoretic.  1.5 cm full-thickness laceration to left fifth digit proximal phalanx on the ulnar aspect, full-strength and sensation, each joint tested in isolation.  Psychiatric: She has a normal mood and affect.  Nursing note and vitals reviewed.    ED Treatments / Results  Labs (all labs ordered are listed, but only abnormal results are displayed) Labs Reviewed - No data to  display  EKG  EKG Interpretation None       Radiology No results found.  Procedures .Marland KitchenLaceration Repair Date/Time: 11/17/2016 1:05 PM Performed by: Monico Blitz Authorized by: Monico Blitz   Consent:    Consent obtained:  Verbal   Consent given by:  Patient Anesthesia (see MAR for exact dosages):    Anesthesia method:  None Laceration details:    Location:  Finger   Finger location:  L small finger   Length (cm):  1.5 Repair type:    Repair type:  Simple Pre-procedure details:    Preparation:  Patient was prepped and draped in usual sterile fashion Exploration:    Wound exploration: wound explored through full range of motion     Contaminated: no   Treatment:    Area cleansed with:  Saline   Amount of cleaning:  Standard   Irrigation solution:  Sterile saline   Irrigation method:  Syringe Skin repair:    Repair method:  Sutures   Suture size:  6-0   Wound skin closure material used: Ethilon.   Suture technique:  Simple interrupted   Number of sutures:  4 Approximation:    Approximation:  Close   Vermilion border: well-aligned   Post-procedure details:    Dressing:  Antibiotic ointment   Patient tolerance of procedure:  Tolerated well, no immediate complications   (including critical care time)  Medications Ordered in ED Medications  lidocaine-EPINEPHrine-tetracaine (LET) solution (3 mLs Topical Given 11/17/16 1154)  lidocaine (XYLOCAINE) 2 % injection 20 mL (20 mLs Infiltration Given 11/17/16 1203)  cephALEXin (KEFLEX) capsule 500 mg (500 mg Oral Given 11/17/16 1303)  bacitracin ointment 1 application (1 application Topical Given 11/17/16 1300)     Initial Impression / Assessment and Plan / ED Course  I have reviewed the triage vital signs and the nursing notes.  Pertinent labs & imaging results that were available during my care of the patient were reviewed by me and considered in my medical decision making (see chart for details).      Vitals:   11/17/16 1122  BP: (!) 161/98  Pulse: 91  Resp: 18  Temp: 98.2 F (36.8 C)  TempSrc: Oral  SpO2: 95%    Medications  lidocaine-EPINEPHrine-tetracaine (LET) solution (3 mLs Topical Given 11/17/16 1154)  lidocaine (XYLOCAINE) 2 % injection 20 mL (20 mLs Infiltration Given 11/17/16 1203)  cephALEXin (KEFLEX) capsule 500 mg (500 mg Oral Given 11/17/16 1303)  bacitracin ointment 1 application (1 application Topical Given 11/17/16 1300)    Jasmine Castro is 61 y.o. female presenting with finger laceration.  No signs of tendon/joint involvement.Tetanus vaccination is up-to-date. Pressure irrigation performed. Laceration occurred < 8 hours prior to repair which was well tolerated. Pt has no co morbidities to effect normal wound healing. Discussed suture home care w pt and answered questions. Pt to f-u for wound check and suture removal in 7 days. Pt is hemodynamically stable with no complaints prior to dc.   Evaluation does not show pathology that would require ongoing emergent intervention or inpatient treatment. Pt is hemodynamically stable and mentating appropriately. Discussed findings and plan with patient/guardian, who agrees with care plan. All questions answered. Return precautions discussed and outpatient follow up given.      Final Clinical Impressions(s) / ED Diagnoses   Final diagnoses:  Laceration of left little finger without foreign body without damage to nail, initial encounter    New Prescriptions New Prescriptions   CEPHALEXIN (KEFLEX) 500 MG CAPSULE    Take 1 capsule (500 mg total) by mouth 2 (two) times daily.     Waynetta Pean 11/17/16 1306    Virgel Manifold, MD 11/18/16 [phone]

## 2016-11-26 DIAGNOSIS — S61412A Laceration without foreign body of left hand, initial encounter: Secondary | ICD-10-CM | POA: Diagnosis not present

## 2016-11-26 DIAGNOSIS — Z4802 Encounter for removal of sutures: Secondary | ICD-10-CM | POA: Diagnosis not present

## 2016-11-26 DIAGNOSIS — Z6835 Body mass index (BMI) 35.0-35.9, adult: Secondary | ICD-10-CM | POA: Diagnosis not present

## 2016-12-21 DIAGNOSIS — L821 Other seborrheic keratosis: Secondary | ICD-10-CM | POA: Diagnosis not present

## 2016-12-21 DIAGNOSIS — Z85820 Personal history of malignant melanoma of skin: Secondary | ICD-10-CM | POA: Diagnosis not present

## 2016-12-21 DIAGNOSIS — D1801 Hemangioma of skin and subcutaneous tissue: Secondary | ICD-10-CM | POA: Diagnosis not present

## 2016-12-21 DIAGNOSIS — D225 Melanocytic nevi of trunk: Secondary | ICD-10-CM | POA: Diagnosis not present

## 2016-12-24 MED FILL — TELMISARTAN 80 MG TABLET: 80 | 90 days supply | Qty: 90 | Fill #2

## 2017-01-07 MED FILL — ROSUVASTATIN CALCIUM 5 MG T: 5 | 90 days supply | Qty: 90 | Fill #2

## 2017-01-16 ENCOUNTER — Other Ambulatory Visit: Payer: Self-pay | Admitting: Obstetrics and Gynecology

## 2017-01-16 DIAGNOSIS — Z1231 Encounter for screening mammogram for malignant neoplasm of breast: Secondary | ICD-10-CM

## 2017-01-21 MED FILL — AMLODIPINE BESYLATE 2.5 MG: 2.5 | 90 days supply | Qty: 90 | Fill #1

## 2017-01-29 DIAGNOSIS — S52111D Torus fracture of upper end of right radius, subsequent encounter for fracture with routine healing: Secondary | ICD-10-CM | POA: Diagnosis not present

## 2017-03-04 DIAGNOSIS — R82998 Other abnormal findings in urine: Secondary | ICD-10-CM | POA: Diagnosis not present

## 2017-03-04 DIAGNOSIS — M859 Disorder of bone density and structure, unspecified: Secondary | ICD-10-CM | POA: Diagnosis not present

## 2017-03-04 DIAGNOSIS — E119 Type 2 diabetes mellitus without complications: Secondary | ICD-10-CM | POA: Diagnosis not present

## 2017-03-04 DIAGNOSIS — Z0000 Encounter for general adult medical examination without abnormal findings: Secondary | ICD-10-CM | POA: Diagnosis not present

## 2017-03-04 DIAGNOSIS — E7849 Other hyperlipidemia: Secondary | ICD-10-CM | POA: Diagnosis not present

## 2017-03-11 DIAGNOSIS — Z1389 Encounter for screening for other disorder: Secondary | ICD-10-CM | POA: Diagnosis not present

## 2017-03-11 DIAGNOSIS — Z23 Encounter for immunization: Secondary | ICD-10-CM | POA: Diagnosis not present

## 2017-03-11 DIAGNOSIS — E7849 Other hyperlipidemia: Secondary | ICD-10-CM | POA: Diagnosis not present

## 2017-03-11 DIAGNOSIS — E668 Other obesity: Secondary | ICD-10-CM | POA: Diagnosis not present

## 2017-03-11 DIAGNOSIS — D72819 Decreased white blood cell count, unspecified: Secondary | ICD-10-CM | POA: Diagnosis not present

## 2017-03-11 DIAGNOSIS — M859 Disorder of bone density and structure, unspecified: Secondary | ICD-10-CM | POA: Diagnosis not present

## 2017-03-11 DIAGNOSIS — Z85820 Personal history of malignant melanoma of skin: Secondary | ICD-10-CM | POA: Insufficient documentation

## 2017-03-11 DIAGNOSIS — R002 Palpitations: Secondary | ICD-10-CM | POA: Diagnosis not present

## 2017-03-11 DIAGNOSIS — N209 Urinary calculus, unspecified: Secondary | ICD-10-CM | POA: Diagnosis not present

## 2017-03-11 DIAGNOSIS — Z0000 Encounter for general adult medical examination without abnormal findings: Secondary | ICD-10-CM | POA: Diagnosis not present

## 2017-03-11 DIAGNOSIS — E119 Type 2 diabetes mellitus without complications: Secondary | ICD-10-CM | POA: Diagnosis not present

## 2017-03-11 DIAGNOSIS — I872 Venous insufficiency (chronic) (peripheral): Secondary | ICD-10-CM | POA: Insufficient documentation

## 2017-03-11 DIAGNOSIS — I10 Essential (primary) hypertension: Secondary | ICD-10-CM | POA: Diagnosis not present

## 2017-03-11 MED FILL — PHENTERMINE 37.5 MG TABLET: 37.5 | 30 days supply | Qty: 30 | Fill #0

## 2017-03-11 MED FILL — metFORMIN HCL 500 MG TABS: 500 | 90 days supply | Qty: 90 | Fill #0

## 2017-03-12 DIAGNOSIS — Z1212 Encounter for screening for malignant neoplasm of rectum: Secondary | ICD-10-CM | POA: Diagnosis not present

## 2017-03-18 MED FILL — TELMISARTAN 80 MG TABLET: 80 | 90 days supply | Qty: 90 | Fill #3

## 2017-03-27 ENCOUNTER — Telehealth: Payer: 59 | Admitting: Family

## 2017-03-27 DIAGNOSIS — J209 Acute bronchitis, unspecified: Secondary | ICD-10-CM | POA: Diagnosis not present

## 2017-03-27 MED ORDER — PREDNISONE 10 MG (21) PO TBPK: ORAL_TABLET | ORAL | 0 refills | Status: DC

## 2017-03-27 MED ORDER — BENZONATATE 100 MG PO CAPS: 100.0000 mg | ORAL_CAPSULE | Freq: Three times a day (TID) | ORAL | 0 refills | Status: DC | PRN

## 2017-03-27 MED FILL — predniSONE 10 MG TABS: 10 | 6 days supply | Qty: 21 | Fill #0

## 2017-03-27 MED FILL — BENZONATATE 100 MG CAPS: 100 | 7 days supply | Qty: 20 | Fill #0

## 2017-03-27 NOTE — Progress Notes (Signed)
We are sorry that you are not feeling well.  Here is how we plan to help!  Based on your presentation I believe you most likely have A cough due to a virus.  This is called viral bronchitis and is best treated by rest, plenty of fluids and control of the cough.  You may use Ibuprofen or Tylenol as directed to help your symptoms.     In addition you may use A non-prescription cough medication called Robitussin DAC. Take 2 teaspoons every 8 hours or Delsym: take 2 teaspoons every 12 hours., A non-prescription cough medication called Mucinex DM: take 2 tablets every 12 hours. and A prescription cough medication called Tessalon Perles 100mg . You may take 1-2 capsules every 8 hours as needed for your cough.  Sterapred 10 mg dosepak  From your responses in the eVisit questionnaire you describe inflammation in the upper respiratory tract which is causing a significant cough.  This is commonly called Bronchitis and has four common causes:    Allergies  Viral Infections  Acid Reflux  Bacterial Infection Allergies, viruses and acid reflux are treated by controlling symptoms or eliminating the cause. An example might be a cough caused by taking certain blood pressure medications. You stop the cough by changing the medication. Another example might be a cough caused by acid reflux. Controlling the reflux helps control the cough.  USE OF BRONCHODILATOR ("RESCUE") INHALERS: There is a risk from using your bronchodilator too frequently.  The risk is that over-reliance on a medication which only relaxes the muscles surrounding the breathing tubes can reduce the effectiveness of medications prescribed to reduce swelling and congestion of the tubes themselves.  Although you feel brief relief from the bronchodilator inhaler, your asthma may actually be worsening with the tubes becoming more swollen and filled with mucus.  This can delay other crucial treatments, such as oral steroid medications. If you need to use  a bronchodilator inhaler daily, several times per day, you should discuss this with your provider.  There are probably better treatments that could be used to keep your asthma under control.     HOME CARE . Only take medications as instructed by your medical team. . Complete the entire course of an antibiotic. . Drink plenty of fluids and get plenty of rest. . Avoid close contacts especially the very young and the elderly . Cover your mouth if you cough or cough into your sleeve. . Always remember to wash your hands . A steam or ultrasonic humidifier can help congestion.   GET HELP RIGHT AWAY IF: . You develop worsening fever. . You become short of breath . You cough up blood. . Your symptoms persist after you have completed your treatment plan MAKE SURE YOU   Understand these instructions.  Will watch your condition.  Will get help right away if you are not doing well or get worse.  Your e-visit answers were reviewed by a board certified advanced clinical practitioner to complete your personal care plan.  Depending on the condition, your plan could have included both over the counter or prescription medications. If there is a problem please reply  once you have received a response from your provider. Your safety is important to [place].  If you have drug allergies check your prescription carefully.    You can use MyChart to ask questions about today's visit, request a non-urgent call back, or ask for a work or school excuse for 24 hours related to this e-Visit. If it has  greater than 24 hours you will need to follow up with your provider, or enter a new e-Visit to address those concerns. You will get an e-mail in the next two days asking about your experience.  I hope that your e-visit has been valuable and will speed your recovery. Thank you for using e-visits.   

## 2017-04-09 ENCOUNTER — Other Ambulatory Visit: Payer: 59

## 2017-04-09 ENCOUNTER — Ambulatory Visit: Payer: 59 | Admitting: Hematology & Oncology

## 2017-04-15 MED FILL — ROSUVASTATIN CALCIUM 5 MG T: 5 | 90 days supply | Qty: 90 | Fill #0

## 2017-04-22 MED FILL — AMLODIPINE BESYLATE 2.5 MG: 2.5 | 90 days supply | Qty: 90 | Fill #2

## 2017-04-25 ENCOUNTER — Inpatient Hospital Stay: Payer: 59 | Attending: Hematology & Oncology

## 2017-04-25 ENCOUNTER — Inpatient Hospital Stay (HOSPITAL_BASED_OUTPATIENT_CLINIC_OR_DEPARTMENT_OTHER): Payer: 59 | Admitting: Hematology & Oncology

## 2017-04-25 VITALS — BP 142/96 | HR 81 | Temp 98.0°F | Resp 19 | Wt 198.2 lb

## 2017-04-25 DIAGNOSIS — Z7984 Long term (current) use of oral hypoglycemic drugs: Secondary | ICD-10-CM

## 2017-04-25 DIAGNOSIS — D72819 Decreased white blood cell count, unspecified: Secondary | ICD-10-CM | POA: Insufficient documentation

## 2017-04-25 DIAGNOSIS — D708 Other neutropenia: Secondary | ICD-10-CM

## 2017-04-25 DIAGNOSIS — Z8781 Personal history of (healed) traumatic fracture: Secondary | ICD-10-CM | POA: Diagnosis not present

## 2017-04-25 DIAGNOSIS — Z79899 Other long term (current) drug therapy: Secondary | ICD-10-CM

## 2017-04-25 LAB — CBC WITH DIFFERENTIAL (CANCER CENTER ONLY)
BASOS ABS: 0 10*3/uL (ref 0.0–0.1)
BASOS PCT: 1 %
EOS ABS: 0.1 10*3/uL (ref 0.0–0.5)
Eosinophils Relative: 6 %
HCT: 40.3 % (ref 34.8–46.6)
HEMOGLOBIN: 13.7 g/dL (ref 11.6–15.9)
Lymphocytes Relative: 48 %
Lymphs Abs: 1.2 10*3/uL (ref 0.9–3.3)
MCH: 30.6 pg (ref 26.0–34.0)
MCHC: 34 g/dL (ref 32.0–36.0)
MCV: 90.2 fL (ref 81.0–101.0)
Monocytes Absolute: 0.6 10*3/uL (ref 0.1–0.9)
Monocytes Relative: 26 %
NEUTROS PCT: 19 %
Neutro Abs: 0.4 10*3/uL — CL (ref 1.5–6.5)
Platelet Count: 279 10*3/uL (ref 145–400)
RBC: 4.47 MIL/uL (ref 3.70–5.32)
RDW: 12.4 % (ref 11.1–15.7)
WBC: 2.4 10*3/uL — AB (ref 3.9–10.0)

## 2017-04-25 LAB — CMP (CANCER CENTER ONLY)
ALK PHOS: 74 U/L (ref 40–150)
ALT: 24 U/L (ref 0–55)
AST: 17 U/L (ref 5–34)
Albumin: 4.1 g/dL (ref 3.5–5.0)
Anion gap: 10 (ref 3–11)
BUN: 12 mg/dL (ref 7–26)
CALCIUM: 9.3 mg/dL (ref 8.4–10.4)
CO2: 26 mmol/L (ref 22–29)
CREATININE: 0.79 mg/dL (ref 0.60–1.10)
Chloride: 105 mmol/L (ref 98–109)
Glucose, Bld: 143 mg/dL — ABNORMAL HIGH (ref 70–140)
Potassium: 4.5 mmol/L (ref 3.5–5.1)
Sodium: 141 mmol/L (ref 136–145)
Total Bilirubin: 0.6 mg/dL (ref 0.2–1.2)
Total Protein: 6.8 g/dL (ref 6.4–8.3)

## 2017-04-25 LAB — SAVE SMEAR

## 2017-04-25 NOTE — Progress Notes (Signed)
Hematology and Oncology Follow Up Visit  Jasmine Castro [id number] November 02, 1955 62 y.o. 04/25/2017   Principle Diagnosis:   Autoimmune neutropenia  Current Therapy:    Observation     Interim History:  Jasmine Castro is back for followup.  She is doing okay.  We last saw her back in August.  Her mother-in-law passed on in 09-21-2022.  They have been trying to get her state all taking care of.  Her house is now up for sale.  She is still working full-time.  She works for the [employer].  She is had no problems with infections.  She did have a little bit of a viral syndrome which everybody had a few weeks ago.  She got over this.  She has had no rashes.  She is had no fever.  She is had no nausea or vomiting.  There is been no change in bowel or bladder habits.  She is recovered from her fall that broke her right forearm.  She says the forearm still gives her a little bit of trouble.    Overall, her performance status is ECOG 0.   Medications:  Current Outpatient Medications:  .  phentermine 37.5 MG capsule, Take 37.5 mg by mouth every morning., Disp: , Rfl:  .  amLODipine (NORVASC) 2.5 MG tablet, Take 2.5 mg by mouth daily., Disp: , Rfl: 11 .  benzonatate (TESSALON PERLES) 100 MG capsule, Take 1 capsule (100 mg total) by mouth 3 (three) times daily as needed., Disp: 20 capsule, Rfl: 0 .  CALCIUM-VITAMIN D PO, Take 1 tablet by mouth daily., Disp: , Rfl:  .  cephALEXin (KEFLEX) 500 MG capsule, Take 1 capsule (500 mg total) by mouth 2 (two) times daily., Disp: 20 capsule, Rfl: 0 .  cholecalciferol (VITAMIN D) 1000 UNITS tablet, Take 1,000 Units by mouth daily., Disp: , Rfl:  .  cycloSPORINE (RESTASIS) 0.05 % ophthalmic emulsion, Restasis MultiDose 0.05 % eye drops, Disp: , Rfl:  .  docusate sodium (COLACE) 100 MG capsule, Take 1 capsule (100 mg total) by mouth 2 (two) times daily., Disp: 10 capsule, Rfl: 0 .  ibuprofen (ADVIL,MOTRIN) 200 MG tablet, Take 200 mg by mouth daily as  needed for headache or moderate pain., Disp: , Rfl:  .  Loteprednol Etabonate (LOTEMAX) 0.5 % GEL, Lotemax 0.5 % eye gel drops, Disp: , Rfl:  .  metFORMIN (GLUCOPHAGE) 500 MG tablet, Take 250 mg by mouth daily., Disp: , Rfl: 3 .  methocarbamol (ROBAXIN) 500 MG tablet, Take 1 tablet (500 mg total) by mouth 4 (four) times daily., Disp: 30 tablet, Rfl: 0 .  Multiple Vitamin (MULTIVITAMIN) capsule, Take 1 capsule by mouth daily., Disp: , Rfl:  .  naproxen sodium (ANAPROX) 220 MG tablet, Take 220 mg by mouth daily as needed (pain)., Disp: , Rfl:  .  predniSONE (STERAPRED UNI-PAK 21 TAB) 10 MG (21) TBPK tablet, Use as directed, Disp: 21 tablet, Rfl: 0 .  rosuvastatin (CRESTOR) 5 MG tablet, Take 5 mg by mouth every morning., Disp: , Rfl:  .  sodium fluoride (PREVIDENT) 1.1 % GEL dental gel, PreviDent 5000 Booster Plus 1.1 % dental paste  USE ONCE A DAY, Disp: , Rfl:  .  telmisartan (MICARDIS) 80 MG tablet, Take 80 mg by mouth every morning. , Disp: , Rfl:  .  vitamin C (ASCORBIC ACID) 500 MG tablet, Take 1 tablet (500 mg total) by mouth daily., Disp: 30 tablet, Rfl: 0 .  vitamin E 400 UNIT capsule,  Take 400 Units by mouth daily., Disp: , Rfl:   Allergies:  Allergies  Allergen Reactions  . Sulfa Antibiotics Anaphylaxis  . Sulfonamide Derivatives Swelling    SWELLING REACTION UNSPECIFIED     Past Medical History, Surgical history, Social history, and Family History were reviewed and updated.  Review of System:   Review of Systems  Constitutional: Negative.   HENT: Negative.   Eyes: Negative.   Respiratory: Negative.   Cardiovascular: Negative.   Gastrointestinal: Negative.   Genitourinary: Negative.   Musculoskeletal: Negative.   Skin: Negative.   Neurological: Negative.   Endo/Heme/Allergies: Negative.   Psychiatric/Behavioral: Negative.     Physical Exam: Physical Exam  Constitutional: She is oriented to person, place, and time.  HENT:  Head: Normocephalic and atraumatic.   Mouth/Throat: Oropharynx is clear and moist.  Eyes: EOM are normal. Pupils are equal, round, and reactive to light.  Neck: Normal range of motion.  Cardiovascular: Normal rate, regular rhythm and normal heart sounds.  Pulmonary/Chest: Effort normal and breath sounds normal.  Abdominal: Soft. Bowel sounds are normal.  Musculoskeletal: Normal range of motion. She exhibits no edema, tenderness or deformity.  Lymphadenopathy:    She has no cervical adenopathy.  Neurological: She is alert and oriented to person, place, and time.  Skin: Skin is warm and dry. No rash noted. No erythema.  Psychiatric: She has a normal mood and affect. Her behavior is normal. Judgment and thought content normal.  Vitals reviewed.    Lab Results  Component Value Date   WBC 2.4 (L) 04/25/2017   HGB 13.8 10/24/2016   HCT 40.3 04/25/2017   MCV 90.2 04/25/2017   PLT 279 04/25/2017     Chemistry      Component Value Date/Time   NA 138 01/20/2016 0806      Component Value Date/Time   CALCIUM 8.9 01/20/2016 0806   ALKPHOS 56 05/11/2008 1625   AST 27 05/11/2008 1625   ALT 36 (H) 05/11/2008 1625   BILITOT 0.8 05/11/2008 1625         Impression and Plan: Jasmine Castro is 62 year old white female. She has chronic neutropenia. We have been following her now for a little over 6 years. She has done well. She's never had issues with infections.  I looked at her blood smear under the microscope.  Everything looked okay.  Her white blood cells were mature.  I saw no immature myeloid or lymphoid cells.  There are no nucleated red blood cells.  Platelets looked adequate.  We will follow her up in 6 more months.  Again, she has had no problems with her leukopenia.  She is already had a bone marrow biopsy done.  This really did not show [place] anything.  She had normal chromosomes.   Volanda Napoleon, MD 2/28/20199:27 AM

## 2017-05-01 DIAGNOSIS — M859 Disorder of bone density and structure, unspecified: Secondary | ICD-10-CM | POA: Diagnosis not present

## 2017-05-13 DIAGNOSIS — Z6833 Body mass index (BMI) 33.0-33.9, adult: Secondary | ICD-10-CM | POA: Diagnosis not present

## 2017-05-13 DIAGNOSIS — Z1389 Encounter for screening for other disorder: Secondary | ICD-10-CM | POA: Diagnosis not present

## 2017-05-13 DIAGNOSIS — Z01419 Encounter for gynecological examination (general) (routine) without abnormal findings: Secondary | ICD-10-CM | POA: Diagnosis not present

## 2017-05-13 DIAGNOSIS — Z130 Encounter for screening for diseases of the blood and blood-forming organs and certain disorders involving the immune mechanism: Secondary | ICD-10-CM | POA: Diagnosis not present

## 2017-05-13 DIAGNOSIS — R8290 Unspecified abnormal findings in urine: Secondary | ICD-10-CM | POA: Diagnosis not present

## 2017-05-13 DIAGNOSIS — Z124 Encounter for screening for malignant neoplasm of cervix: Secondary | ICD-10-CM | POA: Diagnosis not present

## 2017-05-14 DIAGNOSIS — Z124 Encounter for screening for malignant neoplasm of cervix: Secondary | ICD-10-CM | POA: Diagnosis not present

## 2017-05-30 MED FILL — PHENTERMINE 37.5 MG TABLET: 37.5 | 30 days supply | Qty: 30 | Fill #1

## 2017-06-11 MED FILL — TELMISARTAN 80 MG TABS: 80 | 90 days supply | Qty: 90 | Fill #0

## 2017-06-12 DIAGNOSIS — E119 Type 2 diabetes mellitus without complications: Secondary | ICD-10-CM | POA: Diagnosis not present

## 2017-06-13 ENCOUNTER — Ambulatory Visit: Payer: 59

## 2017-06-13 ENCOUNTER — Ambulatory Visit
Admission: RE | Admit: 2017-06-13 | Discharge: 2017-06-13 | Disposition: A | Discharge status: Home / Self Care | Payer: 59 | Source: Ambulatory Visit | Attending: Obstetrics and Gynecology | Admitting: Obstetrics and Gynecology

## 2017-06-13 DIAGNOSIS — Z1231 Encounter for screening mammogram for malignant neoplasm of breast: Secondary | ICD-10-CM | POA: Diagnosis not present

## 2017-06-20 DIAGNOSIS — L918 Other hypertrophic disorders of the skin: Secondary | ICD-10-CM | POA: Diagnosis not present

## 2017-06-20 DIAGNOSIS — Z85820 Personal history of malignant melanoma of skin: Secondary | ICD-10-CM | POA: Diagnosis not present

## 2017-06-20 DIAGNOSIS — L821 Other seborrheic keratosis: Secondary | ICD-10-CM | POA: Diagnosis not present

## 2017-06-20 DIAGNOSIS — D225 Melanocytic nevi of trunk: Secondary | ICD-10-CM | POA: Diagnosis not present

## 2017-07-08 MED FILL — ROSUVASTATIN CALCIUM 5 MG T: 5 | 90 days supply | Qty: 90 | Fill #1

## 2017-07-24 MED FILL — AMLODIPINE BESYLATE 2.5 MG: 2.5 | 90 days supply | Qty: 90 | Fill #3

## 2017-07-24 MED FILL — PHENTERMINE 37.5 MG TABLET: 37.5 | 30 days supply | Qty: 30 | Fill #2

## 2017-09-19 MED FILL — TELMISARTAN 80 MG TABS: 80 | 90 days supply | Qty: 90 | Fill #1

## 2017-09-26 DIAGNOSIS — I10 Essential (primary) hypertension: Secondary | ICD-10-CM | POA: Diagnosis not present

## 2017-09-26 DIAGNOSIS — Z85820 Personal history of malignant melanoma of skin: Secondary | ICD-10-CM | POA: Diagnosis not present

## 2017-09-26 DIAGNOSIS — E119 Type 2 diabetes mellitus without complications: Secondary | ICD-10-CM | POA: Diagnosis not present

## 2017-09-26 DIAGNOSIS — E668 Other obesity: Secondary | ICD-10-CM | POA: Diagnosis not present

## 2017-09-26 DIAGNOSIS — M859 Disorder of bone density and structure, unspecified: Secondary | ICD-10-CM | POA: Diagnosis not present

## 2017-09-26 DIAGNOSIS — Z6835 Body mass index (BMI) 35.0-35.9, adult: Secondary | ICD-10-CM | POA: Diagnosis not present

## 2017-10-07 MED FILL — ROSUVASTATIN CALCIUM 5 MG T: 5 | 90 days supply | Qty: 90 | Fill #2

## 2017-10-09 MED FILL — metFORMIN HCL 500 MG TABS: 500 | 90 days supply | Qty: 90 | Fill #0

## 2017-10-23 ENCOUNTER — Inpatient Hospital Stay: Payer: 59 | Attending: Hematology & Oncology | Admitting: Hematology & Oncology

## 2017-10-23 ENCOUNTER — Other Ambulatory Visit: Payer: Self-pay

## 2017-10-23 ENCOUNTER — Inpatient Hospital Stay: Payer: 59

## 2017-10-23 ENCOUNTER — Telehealth: Payer: Self-pay | Admitting: *Deleted

## 2017-10-23 ENCOUNTER — Encounter: Payer: Self-pay | Admitting: Hematology & Oncology

## 2017-10-23 VITALS — BP 138/65 | HR 68 | Temp 98.3°F | Resp 20 | Wt 197.8 lb

## 2017-10-23 DIAGNOSIS — Z79899 Other long term (current) drug therapy: Secondary | ICD-10-CM | POA: Insufficient documentation

## 2017-10-23 DIAGNOSIS — D708 Other neutropenia: Secondary | ICD-10-CM

## 2017-10-23 LAB — CBC WITH DIFFERENTIAL (CANCER CENTER ONLY)
Basophils Absolute: 0 10*3/uL (ref 0.0–0.1)
Basophils Relative: 1 %
EOS ABS: 0.1 10*3/uL (ref 0.0–0.5)
EOS PCT: 4 %
HCT: 40 % (ref 34.8–46.6)
Hemoglobin: 13.5 g/dL (ref 11.6–15.9)
LYMPHS ABS: 1.3 10*3/uL (ref 0.9–3.3)
LYMPHS PCT: 53 %
MCH: 30.8 pg (ref 26.0–34.0)
MCHC: 33.8 g/dL (ref 32.0–36.0)
MCV: 91.1 fL (ref 81.0–101.0)
MONO ABS: 0.6 10*3/uL (ref 0.1–0.9)
MONOS PCT: 25 %
Neutro Abs: 0.4 10*3/uL — CL (ref 1.5–6.5)
Neutrophils Relative %: 17 %
PLATELETS: 288 10*3/uL (ref 145–400)
RBC: 4.39 MIL/uL (ref 3.70–5.32)
RDW: 12.1 % (ref 11.1–15.7)
WBC Count: 2.4 10*3/uL — ABNORMAL LOW (ref 3.9–10.0)

## 2017-10-23 LAB — LACTATE DEHYDROGENASE: LDH: 101 U/L (ref 98–192)

## 2017-10-23 LAB — TECHNOLOGIST SMEAR REVIEW: Tech Review: NORMAL

## 2017-10-23 NOTE — Progress Notes (Signed)
Hematology and Oncology Follow Up Visit  Jasmine Castro [id number] 1956/01/28 62 y.o. 10/23/2017   Principle Diagnosis:   Autoimmune neutropenia  Current Therapy:    Observation     Interim History:  Ms.  Castro is back for followup.  It has been 6 months since we last saw Jasmine.  She actually is doing quite well.  She is recovered from the fractured right elbow.  She says that the weather and humidity do tend to make it a little bit more stiff.  She is still working.  She is under a lot of stress at work.  She has less people to work with him more work to do.  She is had no problems with fever.  She is had no infections.  She has had no rashes.  She is had no nausea or vomiting.  Is been no change in bowel or bladder habits.  She has had no mouth sores.  There is been no headache.  Jasmine Castro's 62th birthday is coming up this weekend.  She is looking forward to this.      Overall, Jasmine performance status is ECOG 0.   Medications:  Current Outpatient Medications:  .  amLODipine (NORVASC) 2.5 MG tablet, Take 2.5 mg by mouth daily., Disp: , Rfl: 11 .  CALCIUM-VITAMIN D PO, Take 1 tablet by mouth daily., Disp: , Rfl:  .  cholecalciferol (VITAMIN D) 1000 UNITS tablet, Take 1,000 Units by mouth daily., Disp: , Rfl:  .  cycloSPORINE (RESTASIS) 0.05 % ophthalmic emulsion, Restasis MultiDose 0.05 % eye drops, Disp: , Rfl:  .  Loteprednol Etabonate (LOTEMAX) 0.5 % GEL, Lotemax 0.5 % eye gel drops, Disp: , Rfl:  .  metFORMIN (GLUCOPHAGE) 500 MG tablet, Take 500 mg by mouth daily. , Disp: , Rfl: 3 .  Multiple Vitamin (MULTIVITAMIN) capsule, Take 1 capsule by mouth daily., Disp: , Rfl:  .  rosuvastatin (CRESTOR) 5 MG tablet, Take 5 mg by mouth every morning., Disp: , Rfl:  .  telmisartan (MICARDIS) 80 MG tablet, Take 80 mg by mouth every morning. , Disp: , Rfl:  .  vitamin C (ASCORBIC ACID) 500 MG tablet, Take 1 tablet (500 mg total) by mouth daily., Disp: 30 tablet, Rfl: 0 .  vitamin E 400 UNIT  capsule, Take 400 Units by mouth daily., Disp: , Rfl:  .  benzonatate (TESSALON PERLES) 100 MG capsule, Take 1 capsule (100 mg total) by mouth 3 (three) times daily as needed. (Patient not taking: Reported on 10/23/2017), Disp: 20 capsule, Rfl: 0 .  cephALEXin (KEFLEX) 500 MG capsule, Take 1 capsule (500 mg total) by mouth 2 (two) times daily., Disp: 20 capsule, Rfl: 0 .  docusate sodium (COLACE) 100 MG capsule, Take 1 capsule (100 mg total) by mouth 2 (two) times daily. (Patient not taking: Reported on 10/23/2017), Disp: 10 capsule, Rfl: 0 .  ibuprofen (ADVIL,MOTRIN) 200 MG tablet, Take 200 mg by mouth daily as needed for headache or moderate pain., Disp: , Rfl:  .  methocarbamol (ROBAXIN) 500 MG tablet, Take 1 tablet (500 mg total) by mouth 4 (four) times daily., Disp: 30 tablet, Rfl: 0 .  naproxen sodium (ANAPROX) 220 MG tablet, Take 220 mg by mouth daily as needed (pain)., Disp: , Rfl:  .  phentermine 37.5 MG capsule, Take 37.5 mg by mouth every morning., Disp: , Rfl:  .  predniSONE (STERAPRED UNI-PAK 21 TAB) 10 MG (21) TBPK tablet, Use as directed, Disp: 21 tablet, Rfl: 0 .  sodium  fluoride (PREVIDENT) 1.1 % GEL dental gel, PreviDent 5000 Booster Plus 1.1 % dental paste  USE ONCE A DAY, Disp: , Rfl:   Allergies:  Allergies  Allergen Reactions  . Sulfa Antibiotics Anaphylaxis  . Sulfonamide Derivatives Swelling    SWELLING REACTION UNSPECIFIED     Past Medical History, Surgical history, Social history, and Family History were reviewed and updated.  Review of System:   Review of Systems  Constitutional: Negative.   HENT: Negative.   Eyes: Negative.   Respiratory: Negative.   Cardiovascular: Negative.   Gastrointestinal: Negative.   Genitourinary: Negative.   Musculoskeletal: Negative.   Skin: Negative.   Neurological: Negative.   Endo/Heme/Allergies: Negative.   Psychiatric/Behavioral: Negative.     Physical Exam: Physical Exam  Constitutional: She is oriented to person,  place, and time.  HENT:  Head: Normocephalic and atraumatic.  Mouth/Throat: Oropharynx is clear and moist.  Eyes: Pupils are equal, round, and reactive to light. EOM are normal.  Neck: Normal range of motion.  Cardiovascular: Normal rate, regular rhythm and normal heart sounds.  Pulmonary/Chest: Effort normal and breath sounds normal.  Abdominal: Soft. Bowel sounds are normal.  Musculoskeletal: Normal range of motion. She exhibits no edema, tenderness or deformity.  Lymphadenopathy:    She has no cervical adenopathy.  Neurological: She is alert and oriented to person, place, and time.  Skin: Skin is warm and dry. No rash noted. No erythema.  Psychiatric: She has a normal mood and affect. Jasmine behavior is normal. Judgment and thought content normal.  Vitals reviewed.    Lab Results  Component Value Date   WBC 2.4 (L) 10/23/2017   HGB 13.5 10/23/2017   HCT 40.0 10/23/2017   MCV 91.1 10/23/2017   PLT 288 10/23/2017     Chemistry      Component Value Date/Time   NA 141 04/25/2017 0753      Component Value Date/Time   CALCIUM 9.3 04/25/2017 0753   ALKPHOS 74 04/25/2017 0753   AST 17 04/25/2017 0753   ALT 24 04/25/2017 0753   BILITOT 0.6 04/25/2017 0753         Impression and Plan: Jasmine Castro is 62 year old white female. She has chronic neutropenia. We have been following Jasmine now for a little over 6 years. She has done well. She's never had issues with infections.  I looked at Jasmine blood smear under the microscope.  Everything looked okay.  Jasmine white blood cells were mature.  I saw no immature myeloid or lymphoid cells.  There are no nucleated red blood cells.  Platelets looked adequate.  We will follow Jasmine up in 6 more months.  Again, she has had no problems with Jasmine leukopenia.  She is already had a bone marrow biopsy done.  This really did not show [place] anything.  She had normal chromosomes.   Volanda Napoleon, MD 8/28/20198:27 AM

## 2017-10-23 NOTE — Telephone Encounter (Signed)
Critical Value ANC 0.4 Dr Ennever notified. No orders at this time 

## 2017-10-24 MED FILL — AMLODIPINE 2.5 MG TABLET: 2.5 | 90 days supply | Qty: 90 | Fill #0

## 2017-12-10 ENCOUNTER — Telehealth: Payer: 59 | Admitting: Physician Assistant

## 2017-12-10 DIAGNOSIS — R05 Cough: Secondary | ICD-10-CM | POA: Diagnosis not present

## 2017-12-10 DIAGNOSIS — R058 Other specified cough: Secondary | ICD-10-CM

## 2017-12-10 MED ORDER — BENZONATATE 100 MG PO CAPS: 100.0000 mg | ORAL_CAPSULE | Freq: Three times a day (TID) | ORAL | 0 refills | Status: DC

## 2017-12-10 MED FILL — BENZONATATE 100 MG CAPS: 100 | 5 days supply | Qty: 30 | Fill #0

## 2017-12-10 NOTE — Progress Notes (Signed)

## 2017-12-12 DIAGNOSIS — R05 Cough: Secondary | ICD-10-CM | POA: Diagnosis not present

## 2017-12-12 DIAGNOSIS — Z6834 Body mass index (BMI) 34.0-34.9, adult: Secondary | ICD-10-CM | POA: Diagnosis not present

## 2017-12-12 DIAGNOSIS — J019 Acute sinusitis, unspecified: Secondary | ICD-10-CM | POA: Diagnosis not present

## 2017-12-12 MED FILL — AMOX-CLAV 875-125 MG TABLET: 875-125 | 7 days supply | Qty: 14 | Fill #0

## 2017-12-24 DIAGNOSIS — L814 Other melanin hyperpigmentation: Secondary | ICD-10-CM | POA: Diagnosis not present

## 2017-12-24 DIAGNOSIS — D225 Melanocytic nevi of trunk: Secondary | ICD-10-CM | POA: Diagnosis not present

## 2017-12-24 DIAGNOSIS — I872 Venous insufficiency (chronic) (peripheral): Secondary | ICD-10-CM | POA: Diagnosis not present

## 2017-12-24 DIAGNOSIS — L821 Other seborrheic keratosis: Secondary | ICD-10-CM | POA: Diagnosis not present

## 2017-12-24 DIAGNOSIS — Z85820 Personal history of malignant melanoma of skin: Secondary | ICD-10-CM | POA: Diagnosis not present

## 2017-12-30 MED FILL — ROSUVASTATIN CALCIUM 5 MG T: 5 | 90 days supply | Qty: 90 | Fill #0

## 2017-12-30 MED FILL — TELMISARTAN 80 MG TABS: 80 | 90 days supply | Qty: 90 | Fill #2

## 2018-01-06 MED FILL — AMLODIPINE 2.5 MG TABLET: 2.5 | 90 days supply | Qty: 90 | Fill #1

## 2018-01-22 MED FILL — metFORMIN HCL 500 MG TABS: 500 | 90 days supply | Qty: 90 | Fill #1

## 2018-04-16 MED FILL — ROSUVASTATIN CALCIUM 5 MG T: 5 | 90 days supply | Qty: 90 | Fill #1

## 2018-04-16 MED FILL — TELMISARTAN 80 MG TABS: 80 | 90 days supply | Qty: 90 | Fill #3

## 2018-04-16 MED FILL — metFORMIN HCL 500 MG TABS: 500 | 90 days supply | Qty: 90 | Fill #2

## 2018-04-16 MED FILL — AMLODIPINE 2.5 MG TABLET: 2.5 | 90 days supply | Qty: 90 | Fill #2

## 2018-04-23 ENCOUNTER — Ambulatory Visit: Payer: 59 | Admitting: Hematology & Oncology

## 2018-04-23 ENCOUNTER — Other Ambulatory Visit: Payer: 59

## 2018-05-02 ENCOUNTER — Other Ambulatory Visit: Payer: Self-pay | Admitting: Obstetrics and Gynecology

## 2018-05-02 DIAGNOSIS — Z1231 Encounter for screening mammogram for malignant neoplasm of breast: Secondary | ICD-10-CM

## 2018-05-05 DIAGNOSIS — M859 Disorder of bone density and structure, unspecified: Secondary | ICD-10-CM | POA: Diagnosis not present

## 2018-05-05 DIAGNOSIS — R82998 Other abnormal findings in urine: Secondary | ICD-10-CM | POA: Diagnosis not present

## 2018-05-05 DIAGNOSIS — Z0000 Encounter for general adult medical examination without abnormal findings: Secondary | ICD-10-CM | POA: Diagnosis not present

## 2018-05-05 DIAGNOSIS — E119 Type 2 diabetes mellitus without complications: Secondary | ICD-10-CM | POA: Diagnosis not present

## 2018-05-05 DIAGNOSIS — I10 Essential (primary) hypertension: Secondary | ICD-10-CM | POA: Diagnosis not present

## 2018-05-12 DIAGNOSIS — D72819 Decreased white blood cell count, unspecified: Secondary | ICD-10-CM | POA: Diagnosis not present

## 2018-05-12 DIAGNOSIS — Z1331 Encounter for screening for depression: Secondary | ICD-10-CM | POA: Diagnosis not present

## 2018-05-12 DIAGNOSIS — E7849 Other hyperlipidemia: Secondary | ICD-10-CM | POA: Diagnosis not present

## 2018-05-12 DIAGNOSIS — Z85820 Personal history of malignant melanoma of skin: Secondary | ICD-10-CM | POA: Diagnosis not present

## 2018-05-12 DIAGNOSIS — Z0000 Encounter for general adult medical examination without abnormal findings: Secondary | ICD-10-CM | POA: Diagnosis not present

## 2018-05-12 DIAGNOSIS — N209 Urinary calculus, unspecified: Secondary | ICD-10-CM | POA: Diagnosis not present

## 2018-05-12 DIAGNOSIS — I878 Other specified disorders of veins: Secondary | ICD-10-CM | POA: Diagnosis not present

## 2018-05-12 DIAGNOSIS — E119 Type 2 diabetes mellitus without complications: Secondary | ICD-10-CM | POA: Diagnosis not present

## 2018-05-12 DIAGNOSIS — E668 Other obesity: Secondary | ICD-10-CM | POA: Diagnosis not present

## 2018-05-12 DIAGNOSIS — I10 Essential (primary) hypertension: Secondary | ICD-10-CM | POA: Diagnosis not present

## 2018-05-21 DIAGNOSIS — Z1212 Encounter for screening for malignant neoplasm of rectum: Secondary | ICD-10-CM | POA: Diagnosis not present

## 2018-05-27 ENCOUNTER — Other Ambulatory Visit: Payer: 59

## 2018-05-27 ENCOUNTER — Ambulatory Visit: Payer: 59 | Admitting: Hematology & Oncology

## 2018-06-02 DIAGNOSIS — I10 Essential (primary) hypertension: Secondary | ICD-10-CM | POA: Diagnosis not present

## 2018-06-02 DIAGNOSIS — R1032 Left lower quadrant pain: Secondary | ICD-10-CM | POA: Diagnosis not present

## 2018-06-02 DIAGNOSIS — K5792 Diverticulitis of intestine, part unspecified, without perforation or abscess without bleeding: Secondary | ICD-10-CM | POA: Diagnosis not present

## 2018-06-02 MED FILL — CIPROFLOXACIN HCL 500 MG TA: 500 | 7 days supply | Qty: 14 | Fill #0

## 2018-06-02 MED FILL — METRONIDAZOLE 500 MG TABS: 500 | 10 days supply | Qty: 30 | Fill #0

## 2018-06-17 ENCOUNTER — Ambulatory Visit: Payer: 59

## 2018-06-23 ENCOUNTER — Encounter: Payer: Self-pay | Admitting: Internal Medicine

## 2018-06-27 ENCOUNTER — Telehealth: Payer: Self-pay | Admitting: *Deleted

## 2018-06-27 NOTE — Telephone Encounter (Signed)
Message received from patient wanting to delay her scheduled appts on 07/02/18 d/t her son has had a recent liver transplant and she is concerned about being out of her house d/t the coronavirus.  She states that she had a recent WBC done in 04/2018 which was 2.53.  Dr. Marin Olp notified and order received for patient to move scheduled appts in May out to the end of June.  Patient notified of Dr. Antonieta Pert orders.  Message sent to scheduling.

## 2018-06-30 ENCOUNTER — Telehealth: Payer: Self-pay | Admitting: Hematology & Oncology

## 2018-06-30 NOTE — Telephone Encounter (Signed)
Appointments scheduled letter/calendar mailed  °

## 2018-07-02 ENCOUNTER — Other Ambulatory Visit: Payer: 59

## 2018-07-02 ENCOUNTER — Ambulatory Visit: Payer: 59 | Admitting: Hematology & Oncology

## 2018-07-08 DIAGNOSIS — E119 Type 2 diabetes mellitus without complications: Secondary | ICD-10-CM | POA: Diagnosis not present

## 2018-07-14 MED FILL — metFORMIN HCL 500 MG TABS: 500 | 90 days supply | Qty: 90 | Fill #0

## 2018-07-14 MED FILL — ROSUVASTATIN CALCIUM 5 MG T: 5 | 90 days supply | Qty: 90 | Fill #0

## 2018-07-14 MED FILL — TELMISARTAN 80 MG TABS: 80 | 90 days supply | Qty: 90 | Fill #0

## 2018-07-16 DIAGNOSIS — Z6833 Body mass index (BMI) 33.0-33.9, adult: Secondary | ICD-10-CM | POA: Diagnosis not present

## 2018-07-16 DIAGNOSIS — Z130 Encounter for screening for diseases of the blood and blood-forming organs and certain disorders involving the immune mechanism: Secondary | ICD-10-CM | POA: Diagnosis not present

## 2018-07-16 DIAGNOSIS — Z1389 Encounter for screening for other disorder: Secondary | ICD-10-CM | POA: Diagnosis not present

## 2018-07-16 DIAGNOSIS — Z124 Encounter for screening for malignant neoplasm of cervix: Secondary | ICD-10-CM | POA: Diagnosis not present

## 2018-07-16 DIAGNOSIS — R8290 Unspecified abnormal findings in urine: Secondary | ICD-10-CM | POA: Diagnosis not present

## 2018-07-16 DIAGNOSIS — Z01419 Encounter for gynecological examination (general) (routine) without abnormal findings: Secondary | ICD-10-CM | POA: Diagnosis not present

## 2018-07-16 MED FILL — AMLODIPINE 2.5 MG TABLET: 2.5 | 90 days supply | Qty: 90 | Fill #0

## 2018-07-30 ENCOUNTER — Other Ambulatory Visit: Payer: Self-pay

## 2018-07-30 ENCOUNTER — Ambulatory Visit
Admission: RE | Admit: 2018-07-30 | Discharge: 2018-07-30 | Disposition: A | Discharge status: Home / Self Care | Payer: 59 | Source: Ambulatory Visit | Attending: Obstetrics and Gynecology | Admitting: Obstetrics and Gynecology

## 2018-07-30 DIAGNOSIS — Z1231 Encounter for screening mammogram for malignant neoplasm of breast: Secondary | ICD-10-CM | POA: Diagnosis not present

## 2018-08-12 DIAGNOSIS — D1801 Hemangioma of skin and subcutaneous tissue: Secondary | ICD-10-CM | POA: Diagnosis not present

## 2018-08-12 DIAGNOSIS — Z85820 Personal history of malignant melanoma of skin: Secondary | ICD-10-CM | POA: Diagnosis not present

## 2018-08-12 DIAGNOSIS — L820 Inflamed seborrheic keratosis: Secondary | ICD-10-CM | POA: Diagnosis not present

## 2018-08-12 DIAGNOSIS — L821 Other seborrheic keratosis: Secondary | ICD-10-CM | POA: Diagnosis not present

## 2018-08-12 DIAGNOSIS — D225 Melanocytic nevi of trunk: Secondary | ICD-10-CM | POA: Diagnosis not present

## 2018-08-14 ENCOUNTER — Ambulatory Visit (INDEPENDENT_AMBULATORY_CARE_PROVIDER_SITE_OTHER): Payer: 59 | Admitting: Internal Medicine

## 2018-08-14 ENCOUNTER — Other Ambulatory Visit: Payer: Self-pay

## 2018-08-14 ENCOUNTER — Encounter: Payer: Self-pay | Admitting: Internal Medicine

## 2018-08-14 VITALS — Ht 64.0 in | Wt 190.0 lb

## 2018-08-14 DIAGNOSIS — R1310 Dysphagia, unspecified: Secondary | ICD-10-CM

## 2018-08-14 DIAGNOSIS — Z1211 Encounter for screening for malignant neoplasm of colon: Secondary | ICD-10-CM | POA: Diagnosis not present

## 2018-08-14 DIAGNOSIS — R1319 Other dysphagia: Secondary | ICD-10-CM

## 2018-08-14 NOTE — Progress Notes (Signed)
TELEHEALTH ENCOUNTER IN SETTING OF COVID-19 PANDEMIC - REQUESTED BY PATIENT SERVICE PROVIDED BY TELEMEDECINE - TYPE: Zoom AV PATIENT LOCATION: Home PATIENT HAS CONSENTED TO TELEHEALTH VISIT PROVIDER LOCATION: OFFICE REFERRING PROVIDER:PCP is Dr. Virgina Jock PARTICIPANTS OTHER THAN PATIENT: None TIME SPENT ON CALL: 13 minutes MD-patient    Jasmine Castro 63 y.o. 08-Nov-1955 [id number]  Assessment & Plan:   Encounter Diagnoses  Name Primary?  . Esophageal dysphagia Yes  . Colon cancer screening    The dysphagia sounds like it could be something like a ring or stricture, dysmotility is much less likely.  Hiatal hernia on EGD in 2010 was small.  Looking back at those images I think there may have been a patent ring at the GE junction.  She was not having dysphagia then.  EGD to evaluate and treat the dysphagia with likely esophageal dilation.  Colonoscopy for screening.  The risks and benefits as well as alternatives of endoscopic procedure(s) have been discussed and reviewed. All questions answered. The patient agrees to proceed. I have reviewed the potential for COVID-19 infection transmission by presenting to our facility, we think it is low we are following guidelines to reduce that but cannot guarantee.  I appreciate the opportunity to care for this patient. CC: Shon Baton, MD  Subjective:   Chief Complaint: Dysphagia and due for colon cancer screening  HPI Jasmine Castro is a 63 year old white woman who is known to me from previous EGD and colonoscopy for right upper quadrant pain and it showed a small hiatal hernia about 2 cm, in 2010.  Colonoscopy at that time was negative for neoplasia i.e. a negative screening colonoscopy.  She has not been seen since.  She is having intermittent dysphagia mainly to rice.  She will have to stop stand up and wait for that the past.  There is no unintentional weight loss.  No significant heartburn.  No abdominal pain change in bowel habits bleeding  reported.  She does have a chronic leukopenia evaluated by Dr. Marin Olp.  No underlying cause found.  Social history pertinent in that her son had a liver transplant at [facility], about 7 months ago and she and her husband have been busy helping him off and on.  He has had a recent MRI with an abnormality that is being investigated so that is of some concern.  Also her 31 year old mother recently fractured her ankle and Felisha is an only child and has to attend to that.  She is working remotely during the pandemic.  She works for [employer] as an Web designer. Allergies  Allergen Reactions  . Sulfa Antibiotics Anaphylaxis  . Sulfonamide Derivatives Swelling    SWELLING REACTION UNSPECIFIED    Current Meds  Medication Sig  . amLODipine (NORVASC) 2.5 MG tablet Take 2.5 mg by mouth daily.  Marland Kitchen CALCIUM-VITAMIN D PO Take 1 tablet by mouth daily.  . cholecalciferol (VITAMIN D) 1000 UNITS tablet Take 1,000 Units by mouth daily.  . cycloSPORINE (RESTASIS) 0.05 % ophthalmic emulsion Place 1 drop into both eyes as needed.   Marland Kitchen ibuprofen (ADVIL,MOTRIN) 200 MG tablet Take 200 mg by mouth daily as needed for headache or moderate pain.  . metFORMIN (GLUCOPHAGE) 500 MG tablet Take 500 mg by mouth daily.   . Multiple Vitamin (MULTIVITAMIN) capsule Take 1 capsule by mouth daily.  . rosuvastatin (CRESTOR) 5 MG tablet Take 5 mg by mouth every morning.  Marland Kitchen telmisartan (MICARDIS) 80 MG tablet Take 80 mg by  mouth every morning.   . vitamin C (ASCORBIC ACID) 500 MG tablet Take 1 tablet (500 mg total) by mouth daily.  . vitamin E 400 UNIT capsule Take 400 Units by mouth daily.   Past Medical History:  Diagnosis Date  . Anemia   . Closed fracture of right proximal radius 01/20/2016  . Hyperlipidemia   . Hypertension   . Leukopenia 01/31/2011  . Mastodynia 03/20/2011  . PONV (postoperative nausea and vomiting)   . Torus fracture of distal end of right radius    Past  Surgical History:  Procedure Laterality Date  . COLONOSCOPY    . ORIF ELBOW FRACTURE Right 01/20/2016   Procedure: OPEN REDUCTION INTERNAL FIXATION (ORIF) RIGHT ELBOW/OLECRANON FRACTURE;  Surgeon: Iran Planas, MD;  Location: [facility];  Service: Orthopedics;  Laterality: Right;  . RADIAL HEAD ARTHROPLASTY Right 01/20/2016   Procedure: RIGHT RADIAL HEAD ARTHROPLASTY;  Surgeon: Iran Planas, MD;  Location: [facility];  Service: Orthopedics;  Laterality: Right;  . TONSILLECTOMY     at age 51  . TUBAL LIGATION  1993   Social History   Social History Narrative   Married, Editor, commissioning at [employer]   No alcohol tobacco or drug use   Has at least 1 child as a son has had a liver transplant in 2019   family history includes Breast cancer (age of onset: 9) in her mother; Cancer in her mother; Heart disease in her maternal aunt, maternal grandfather, maternal grandmother, and maternal uncle.   Review of Systems Having some knee pain.  She feels like her health is otherwise okay i.e. negative review of systems otherwise except per HPI.

## 2018-08-14 NOTE — Patient Instructions (Signed)
It was good to see and talk to you via the Zoom app today.   Regarding the swallowing problems, the medical term for that is dysphagia, you will be scheduled for an upper GI endoscopy with esophageal dilation.  Since you are due for a repeat screening colonoscopy we will schedule you for that on the same day.   I appreciate the opportunity to care for you. Gatha Mayer, MD, [facility]

## 2018-08-25 ENCOUNTER — Other Ambulatory Visit: Payer: Self-pay

## 2018-08-25 ENCOUNTER — Inpatient Hospital Stay (HOSPITAL_BASED_OUTPATIENT_CLINIC_OR_DEPARTMENT_OTHER): Payer: 59 | Admitting: Hematology & Oncology

## 2018-08-25 ENCOUNTER — Inpatient Hospital Stay: Payer: 59 | Attending: Hematology & Oncology

## 2018-08-25 ENCOUNTER — Telehealth: Payer: Self-pay

## 2018-08-25 ENCOUNTER — Encounter: Payer: Self-pay | Admitting: Hematology & Oncology

## 2018-08-25 VITALS — BP 143/79 | HR 83 | Temp 98.4°F | Resp 18 | Wt 196.0 lb

## 2018-08-25 DIAGNOSIS — D708 Other neutropenia: Secondary | ICD-10-CM

## 2018-08-25 DIAGNOSIS — D709 Neutropenia, unspecified: Secondary | ICD-10-CM | POA: Insufficient documentation

## 2018-08-25 DIAGNOSIS — Z7984 Long term (current) use of oral hypoglycemic drugs: Secondary | ICD-10-CM | POA: Insufficient documentation

## 2018-08-25 DIAGNOSIS — Z79899 Other long term (current) drug therapy: Secondary | ICD-10-CM | POA: Diagnosis not present

## 2018-08-25 LAB — CBC WITH DIFFERENTIAL (CANCER CENTER ONLY)
Abs Immature Granulocytes: 0.01 10*3/uL (ref 0.00–0.07)
Basophils Absolute: 0 10*3/uL (ref 0.0–0.1)
Basophils Relative: 1 %
Eosinophils Absolute: 0.1 10*3/uL (ref 0.0–0.5)
Eosinophils Relative: 3 %
HCT: 39.9 % (ref 36.0–46.0)
Hemoglobin: 13.2 g/dL (ref 12.0–15.0)
Immature Granulocytes: 0 %
Lymphocytes Relative: 43 %
Lymphs Abs: 1.3 10*3/uL (ref 0.7–4.0)
MCH: 30.6 pg (ref 26.0–34.0)
MCHC: 33.1 g/dL (ref 30.0–36.0)
MCV: 92.6 fL (ref 80.0–100.0)
Monocytes Absolute: 0.5 10*3/uL (ref 0.1–1.0)
Monocytes Relative: 19 %
Neutro Abs: 1 10*3/uL — ABNORMAL LOW (ref 1.7–7.7)
Neutrophils Relative %: 34 %
Platelet Count: 301 10*3/uL (ref 150–400)
RBC: 4.31 MIL/uL (ref 3.87–5.11)
RDW: 11.9 % (ref 11.5–15.5)
WBC Count: 2.9 10*3/uL — ABNORMAL LOW (ref 4.0–10.5)
nRBC: 0 % (ref 0.0–0.2)

## 2018-08-25 LAB — CMP (CANCER CENTER ONLY)
ALT: 20 U/L (ref 0–44)
AST: 14 U/L — ABNORMAL LOW (ref 15–41)
Albumin: 4.4 g/dL (ref 3.5–5.0)
Alkaline Phosphatase: 53 U/L (ref 38–126)
Anion gap: 7 (ref 5–15)
BUN: 14 mg/dL (ref 8–23)
CO2: 29 mmol/L (ref 22–32)
Calcium: 9.1 mg/dL (ref 8.9–10.3)
Chloride: 103 mmol/L (ref 98–111)
Creatinine: 0.74 mg/dL (ref 0.44–1.00)
GFR, Est AFR Am: >60 mL/min (ref >60)
GFR, Estimated: >60 mL/min (ref >60)
Glucose, Bld: 187 mg/dL — ABNORMAL HIGH (ref 70–99)
Potassium: 4.1 mmol/L (ref 3.5–5.1)
Sodium: 139 mmol/L (ref 135–145)
Total Bilirubin: 0.7 mg/dL (ref 0.3–1.2)
Total Protein: 6.5 g/dL (ref 6.5–8.1)

## 2018-08-25 LAB — LACTATE DEHYDROGENASE: LDH: 87 U/L — ABNORMAL LOW (ref 98–192)

## 2018-08-25 LAB — SAVE SMEAR (SSMR)

## 2018-08-25 NOTE — Telephone Encounter (Signed)
Left detailed message for Jasmine Castro to call me back to set up ECL/DIL in the [facility]. She did a televisit with Dr Carlean Purl on 08/14/2018 and I was unable to reach her after her visit. We will await her call.

## 2018-08-25 NOTE — Progress Notes (Signed)
Hematology and Oncology Follow Up Visit  Jasmine Castro [id number] 09-26-1955 63 y.o. 08/25/2018   Principle Diagnosis:   Autoimmune neutropenia  Current Therapy:    Observation     Interim History:  Ms.  Castro is back for followup.  She really has had quite a bit of a time since we last saw her.  The big issue by 4 is the fact that her son who is 61 years old needed any emergent liver transplant.    Apparently, he developed acute liver failure in November.  Because he was [employer], he was sent to [facility]  They obviously did a fantastic job on him.  He got a liver in 3 days.  However, his hospital stay was close to 3 months.  He had compression fasciitis in the left leg.  He needed 10 surgeries for this.   He now is possibly developing rejection.  He goes for a liver biopsy at [facility].  Her mother, who was 46 years old, fell and broke her ankle.  Given that Jasmine Castro is a only child, she is a 1 who has to take care of her.  She is still working.  Thankfully, she can work from home.  She has had no problems infections.  She has had no fever.  She has been very cautious with the coronavirus.  Overall, her performance status is ECOG 0.   Medications:  Current Outpatient Medications:  .  amLODipine (NORVASC) 2.5 MG tablet, Take 2.5 mg by mouth daily., Disp: , Rfl: 11 .  CALCIUM-VITAMIN D PO, Take 1 tablet by mouth daily., Disp: , Rfl:  .  cholecalciferol (VITAMIN D) 1000 UNITS tablet, Take 1,000 Units by mouth daily., Disp: , Rfl:  .  cycloSPORINE (RESTASIS) 0.05 % ophthalmic emulsion, Place 1 drop into both eyes as needed. , Disp: , Rfl:  .  ibuprofen (ADVIL,MOTRIN) 200 MG tablet, Take 200 mg by mouth daily as needed for headache or moderate pain., Disp: , Rfl:  .  metFORMIN (GLUCOPHAGE) 500 MG tablet, Take 500 mg by mouth daily. , Disp: , Rfl: 3 .  Multiple Vitamin (MULTIVITAMIN) capsule, Take 1 capsule by mouth daily., Disp: , Rfl:  .  rosuvastatin (CRESTOR) 5 MG tablet, Take 5 mg by  mouth every morning., Disp: , Rfl:  .  telmisartan (MICARDIS) 80 MG tablet, Take 80 mg by mouth every morning. , Disp: , Rfl:  .  vitamin C (ASCORBIC ACID) 500 MG tablet, Take 1 tablet (500 mg total) by mouth daily., Disp: 30 tablet, Rfl: 0 .  vitamin E 400 UNIT capsule, Take 400 Units by mouth daily., Disp: , Rfl:   Allergies:  Allergies  Allergen Reactions  . Sulfa Antibiotics Anaphylaxis  . Sulfonamide Derivatives Swelling    SWELLING REACTION UNSPECIFIED     Past Medical History, Surgical history, Social history, and Family History were reviewed and updated.  Review of System:   Review of Systems  Constitutional: Negative.   HENT: Negative.   Eyes: Negative.   Respiratory: Negative.   Cardiovascular: Negative.   Gastrointestinal: Negative.   Genitourinary: Negative.   Musculoskeletal: Negative.   Skin: Negative.   Neurological: Negative.   Endo/Heme/Allergies: Negative.   Psychiatric/Behavioral: Negative.     Physical Exam: Physical Exam Vitals signs reviewed.  HENT:     Head: Normocephalic and atraumatic.  Eyes:     Pupils: Pupils are equal, round, and reactive to light.  Neck:     Musculoskeletal: Normal range of motion.  Cardiovascular:  Rate and Rhythm: Normal rate and regular rhythm.     Heart sounds: Normal heart sounds.  Pulmonary:     Effort: Pulmonary effort is normal.     Breath sounds: Normal breath sounds.  Abdominal:     General: Bowel sounds are normal.     Palpations: Abdomen is soft.  Musculoskeletal: Normal range of motion.        General: No tenderness or deformity.  Lymphadenopathy:     Cervical: No cervical adenopathy.  Skin:    General: Skin is warm and dry.     Findings: No erythema or rash.  Neurological:     Mental Status: She is alert and oriented to person, place, and time.  Psychiatric:        Behavior: Behavior normal.        Thought Content: Thought content normal.        Judgment: Judgment normal.      Lab Results   Component Value Date   WBC 2.9 (L) 08/25/2018   HGB 13.2 08/25/2018   HCT 39.9 08/25/2018   MCV 92.6 08/25/2018   PLT 301 08/25/2018     Chemistry      Component Value Date/Time   NA 141 04/25/2017 0753      Component Value Date/Time   CALCIUM 9.3 04/25/2017 0753   ALKPHOS 74 04/25/2017 0753   AST 17 04/25/2017 0753   ALT 24 04/25/2017 0753   BILITOT 0.6 04/25/2017 0753         Impression and Plan: Jasmine Castro is 63 year old white female. She has chronic neutropenia. We have been following her now for a little over 6 years.   I am absolutely amazed that her white cell count is better.  Again, I think God certainly has had a hand and this.  I think that He wanted to make sure that she was going to be healthy to take care of her son and her mother.  I really think that we can get her back now in 1 year.    I looked at her blood smear under the microscope.  Everything looked okay.  Her white blood cells were mature.  I saw no immature myeloid or lymphoid cells.  There are no nucleated red blood cells.  Platelets looked adequate.  Jasmine Napoleon, MD 6/29/20208:52 AM

## 2018-09-10 NOTE — Telephone Encounter (Signed)
I spoke with Jasmine Castro and she said her son has recently had a liver transplant and that at this time she cannot book her ECL. She will call back when she can book it.

## 2018-10-09 MED FILL — ROSUVASTATIN CALCIUM 5 MG T: 5 | 90 days supply | Qty: 90 | Fill #0

## 2018-10-09 MED FILL — AMLODIPINE 2.5 MG TABLET: 2.5 | 90 days supply | Qty: 90 | Fill #0

## 2018-10-09 MED FILL — TELMISARTAN 80 MG TABS: 80 | 90 days supply | Qty: 90 | Fill #0

## 2018-10-28 ENCOUNTER — Telehealth: Payer: Self-pay | Admitting: Internal Medicine

## 2018-10-28 DIAGNOSIS — R1319 Other dysphagia: Secondary | ICD-10-CM

## 2018-10-28 DIAGNOSIS — R1310 Dysphagia, unspecified: Secondary | ICD-10-CM

## 2018-10-28 DIAGNOSIS — Z1211 Encounter for screening for malignant neoplasm of colon: Secondary | ICD-10-CM

## 2018-10-29 ENCOUNTER — Encounter: Payer: Self-pay | Admitting: Internal Medicine

## 2018-10-29 NOTE — Telephone Encounter (Signed)
I'm emailing Jasmine Castro her instructions per her request. She will also be able to see these in her [facility]. I'm mailing her the consent forms along with an envelope to mail them back to me. She will call with any questions.

## 2018-10-29 NOTE — Telephone Encounter (Signed)
Jasmine Castro had a televisit in June and had to delay her procedures due to her son's health. Per Dr Carlean Purl we have set her up for her ECL in the [facility]. Chart information updated.

## 2018-11-17 DIAGNOSIS — D72819 Decreased white blood cell count, unspecified: Secondary | ICD-10-CM | POA: Diagnosis not present

## 2018-11-17 DIAGNOSIS — E669 Obesity, unspecified: Secondary | ICD-10-CM | POA: Diagnosis not present

## 2018-11-17 DIAGNOSIS — I10 Essential (primary) hypertension: Secondary | ICD-10-CM | POA: Diagnosis not present

## 2018-11-17 DIAGNOSIS — L821 Other seborrheic keratosis: Secondary | ICD-10-CM | POA: Insufficient documentation

## 2018-11-17 DIAGNOSIS — Z85820 Personal history of malignant melanoma of skin: Secondary | ICD-10-CM | POA: Diagnosis not present

## 2018-11-17 DIAGNOSIS — K5730 Diverticulosis of large intestine without perforation or abscess without bleeding: Secondary | ICD-10-CM | POA: Insufficient documentation

## 2018-11-17 DIAGNOSIS — M1990 Unspecified osteoarthritis, unspecified site: Secondary | ICD-10-CM | POA: Diagnosis not present

## 2018-11-17 DIAGNOSIS — R1310 Dysphagia, unspecified: Secondary | ICD-10-CM | POA: Diagnosis not present

## 2018-11-17 DIAGNOSIS — E119 Type 2 diabetes mellitus without complications: Secondary | ICD-10-CM | POA: Diagnosis not present

## 2018-11-21 ENCOUNTER — Encounter: Payer: Self-pay | Admitting: Internal Medicine

## 2018-11-28 DIAGNOSIS — E119 Type 2 diabetes mellitus without complications: Secondary | ICD-10-CM | POA: Diagnosis not present

## 2018-11-28 MED FILL — metFORMIN HCL 500 MG TABS: 500 | 30 days supply | Qty: 60 | Fill #0

## 2018-12-03 ENCOUNTER — Telehealth: Payer: Self-pay

## 2018-12-03 NOTE — Telephone Encounter (Signed)
Patient called back and answered "NO" to all screening questions. °

## 2018-12-03 NOTE — Telephone Encounter (Signed)
Covid-19 screening questions   Do you now or have you had a fever in the last 14 days?  Do you have any respiratory symptoms of shortness of breath or cough now or in the last 14 days?  Do you have any family members or close contacts with diagnosed or suspected Covid-19 in the past 14 days?  Have you been tested for Covid-19 and found to be positive?       

## 2018-12-04 ENCOUNTER — Other Ambulatory Visit: Payer: Self-pay

## 2018-12-04 ENCOUNTER — Other Ambulatory Visit: Payer: Self-pay | Admitting: Internal Medicine

## 2018-12-04 ENCOUNTER — Encounter: Payer: Self-pay | Admitting: Internal Medicine

## 2018-12-04 ENCOUNTER — Ambulatory Visit (AMBULATORY_SURGERY_CENTER): Payer: 59 | Admitting: Internal Medicine

## 2018-12-04 VITALS — BP 111/58 | HR 67 | Temp 98.1°F | Resp 26 | Ht 64.0 in | Wt 190.0 lb

## 2018-12-04 DIAGNOSIS — R1310 Dysphagia, unspecified: Secondary | ICD-10-CM

## 2018-12-04 DIAGNOSIS — I10 Essential (primary) hypertension: Secondary | ICD-10-CM | POA: Diagnosis not present

## 2018-12-04 DIAGNOSIS — K317 Polyp of stomach and duodenum: Secondary | ICD-10-CM

## 2018-12-04 DIAGNOSIS — D120 Benign neoplasm of cecum: Secondary | ICD-10-CM | POA: Diagnosis not present

## 2018-12-04 DIAGNOSIS — E119 Type 2 diabetes mellitus without complications: Secondary | ICD-10-CM | POA: Diagnosis not present

## 2018-12-04 DIAGNOSIS — K621 Rectal polyp: Secondary | ICD-10-CM | POA: Diagnosis not present

## 2018-12-04 DIAGNOSIS — Z1211 Encounter for screening for malignant neoplasm of colon: Secondary | ICD-10-CM

## 2018-12-04 DIAGNOSIS — K222 Esophageal obstruction: Secondary | ICD-10-CM | POA: Diagnosis not present

## 2018-12-04 DIAGNOSIS — R1319 Other dysphagia: Secondary | ICD-10-CM

## 2018-12-04 DIAGNOSIS — K2100 Gastro-esophageal reflux disease with esophagitis, without bleeding: Secondary | ICD-10-CM

## 2018-12-04 DIAGNOSIS — D128 Benign neoplasm of rectum: Secondary | ICD-10-CM | POA: Diagnosis not present

## 2018-12-04 MED ORDER — SODIUM CHLORIDE 0.9 % IV SOLN
500.0000 mL | Freq: Once | INTRAVENOUS | Status: DC
Start: 2018-12-04 — End: 2018-12-04

## 2018-12-04 MED ORDER — PANTOPRAZOLE SODIUM 20 MG PO TBEC: 20.0000 mg | DELAYED_RELEASE_TABLET | Freq: Every day | ORAL | 3 refills | Status: DC

## 2018-12-04 MED FILL — PANTOPRAZOLE SOD DR 20 MG T: 20 | 90 days supply | Qty: 90 | Fill #0

## 2018-12-04 NOTE — Patient Instructions (Addendum)
I found some esophagitis, a stomach polyp and an esophageal stricture (narrow area of scar tissue related to chronic reflux). I took the polyp out and believe it is benign.  I have prescribed pantoprazole to take daily before breakfast to reduce acid and heal the esophagitis and prevent stricture recurrence.  Follow an antireflux regimen.  This includes:      - Do not lie down for at least 3 to 4 hours after meals.       - Raise the head of the bed 4 to 6 inches.        - Decrease excess weight.  You have lost from last year and that is good but reducing belly fat can really help you and your health. A good website for advice is dietdoctor.com - there is a free and a paid part - I recommend it to many patiients and recommend a low carb eating plan for you. Much more information there. Another site to look at is drberry.com and he also has a [employer].  No one right answer but I like how these diets do not count calories as much as help you change what and how you eat and you can lose weight without being hungry.       - Avoid citrus juices and other acidic foods, alcohol, chocolate, mints, coffee and other caffeinated beverages, carbonated beverages, fatty and fried foods.       - Avoid tight-fitting clothing.       - Avoid cigarettes and other tobacco products.     I found and removed 3 tiny colon/rectal polyps and saw diverticulosis.  I will let you know pathology results and when to have another routine colonoscopy by mail and/or My Chart.  I appreciate the opportunity to care for you. Gatha Mayer, MD, FACG  YOU HAD AN ENDOSCOPIC PROCEDURE TODAY AT [facility]:   Refer to the procedure report that was given to you for any specific questions about what was found during the examination.  If the procedure report does not answer your questions, please call your gastroenterologist to clarify.  If you requested that your care partner not be given the details of your  procedure findings, then the procedure report has been included in a sealed envelope for you to review at your convenience later.  YOU SHOULD EXPECT: Some feelings of bloating in the abdomen. Passage of more gas than usual.  Walking can help get rid of the air that was put into your GI tract during the procedure and reduce the bloating. If you had a lower endoscopy (such as a colonoscopy or flexible sigmoidoscopy) you may notice spotting of blood in your stool or on the toilet paper. If you underwent a bowel prep for your procedure, you may not have a normal bowel movement for a few days.  Please Note:  You might notice some irritation and congestion in your nose or some drainage.  This is from the oxygen used during your procedure.  There is no need for concern and it should clear up in a day or so.  SYMPTOMS TO REPORT IMMEDIATELY:   Following lower endoscopy (colonoscopy or flexible sigmoidoscopy):  Excessive amounts of blood in the stool  Significant tenderness or worsening of abdominal pains  Swelling of the abdomen that is new, acute  Fever of 100F or higher   Following upper endoscopy (EGD)  Vomiting of blood or coffee ground material  New chest pain or pain under the  shoulder blades  Painful or persistently difficult swallowing  New shortness of breath  Fever of 100F or higher  Black, tarry-looking stools  For urgent or emergent issues, a gastroenterologist can be reached at any hour by calling [phone].   DIET:  We do recommend a small meal at first, but then you may proceed to your regular diet.  Drink plenty of fluids but you should avoid alcoholic beverages for 24 hours.  ACTIVITY:  You should plan to take it easy for the rest of today and you should NOT DRIVE or use heavy machinery until tomorrow (because of the sedation medicines used during the test).    FOLLOW UP: Our staff will call the number listed on your records 48-72 hours following your procedure to check  on you and address any questions or concerns that you may have regarding the information given to you following your procedure. If we do not reach you, we will leave a message.  We will attempt to reach you two times.  During this call, we will ask if you have developed any symptoms of COVID 19. If you develop any symptoms (ie: fever, flu-like symptoms, shortness of breath, cough etc.) before then, please call [phone].  If you test positive for Covid 19 in the 2 weeks post procedure, please call and report this information to [place].    If any biopsies were taken you will be contacted by phone or by letter within the next 1-3 weeks.  Please call us at [phone] if you have not heard about the biopsies in 3 weeks.    SIGNATURES/CONFIDENTIALITY: You and/or your care partner have signed paperwork which will be entered into your electronic medical record.  These signatures attest to the fact that that the information above on your After Visit Summary has been reviewed and is understood.  Full responsibility of the confidentiality of this discharge information lies with you and/or your care-partner.

## 2018-12-04 NOTE — Progress Notes (Signed)
Report to PACU, RN, vss, BBS= Clear.  

## 2018-12-04 NOTE — Op Note (Signed)
[facility] Patient Name: Jasmine Castro Procedure Date: 12/04/2018 10:44 AM MRN: [id number] Endoscopist: Gatha Mayer , MD Age: 63 Referring MD:  Date of Birth: May 24, 1955 Gender: Female Account #: [id number] Procedure:                Colonoscopy Indications:              Screening for colorectal malignant neoplasm, Last                            colonoscopy: 2010 Medicines:                Propofol per Anesthesia, Monitored Anesthesia Care Procedure:                Pre-Anesthesia Assessment:                           - Prior to the procedure, a History and Physical                            was performed, and patient medications and                            allergies were reviewed. The patient's tolerance of                            previous anesthesia was also reviewed. The risks                            and benefits of the procedure and the sedation                            options and risks were discussed with the patient.                            All questions were answered, and informed consent                            was obtained. Prior Anticoagulants: The patient has                            taken no previous anticoagulant or antiplatelet                            agents. ASA Grade Assessment: II - A patient with                            mild systemic disease. After reviewing the risks                            and benefits, the patient was deemed in                            satisfactory condition to undergo the procedure.  After obtaining informed consent, the colonoscope                            was passed under direct vision. Throughout the                            procedure, the patient's blood pressure, pulse, and                            oxygen saturations were monitored continuously. The                            Colonoscope was introduced through the anus and                            advanced to the the  cecum, identified by                            appendiceal orifice and ileocecal valve. The                            colonoscopy was performed without difficulty. The                            patient tolerated the procedure well. The quality                            of the bowel preparation was good. The bowel                            preparation used was Miralax via split dose                            instruction. The ileocecal valve, appendiceal                            orifice, and rectum were photographed. Scope In: 11:04:18 AM Scope Out: 11:17:02 AM Scope Withdrawal Time: 0 hours 10 minutes 38 seconds  Total Procedure Duration: 0 hours 12 minutes 44 seconds  Findings:                 The perianal examination was normal.                           The digital rectal exam findings include rectocele.                           Three sessile polyps were found in the rectum and                            cecum. The polyps were diminutive in size. These                            polyps were removed with a cold snare. Resection  and retrieval were complete. Verification of                            patient identification for the specimen was done.                            Estimated blood loss was minimal.                           Multiple diverticula were found in the sigmoid                            colon.                           The exam was otherwise without abnormality on                            direct and retroflexion views. Complications:            No immediate complications. Estimated Blood Loss:     Estimated blood loss was minimal. Impression:               - Rectocele found on digital rectal exam.                           - Three diminutive polyps in the rectum and in the                            cecum, removed with a cold snare. Resected and                            retrieved.                           - Diverticulosis in  the sigmoid colon.                           - The examination was otherwise normal on direct                            and retroflexion views. Recommendation:           - Patient has a contact number available for                            emergencies. The signs and symptoms of potential                            delayed complications were discussed with the                            patient. Return to normal activities tomorrow.                            Written discharge instructions were provided to the  patient.                           - Clear liquids x 1 hour then soft foods rest of                            day. Start prior diet tomorrow.                           - Continue present medications.                           - Repeat colonoscopy is recommended. The                            colonoscopy date will be determined after pathology                            results from today's exam become available for                            review. Gatha Mayer, MD 12/04/2018 11:38:14 AM This report has been signed electronically.

## 2018-12-04 NOTE — Op Note (Addendum)
[facility] Patient Name: Jasmine Castro Procedure Date: 12/04/2018 10:44 AM MRN: [id number] Endoscopist: Gatha Mayer , MD Age: 63 Referring MD:  Date of Birth: 15-Dec-1955 Gender: Female Account #: [id number] Procedure:                Upper GI endoscopy Indications:              Dysphagia Medicines:                Propofol per Anesthesia, Monitored Anesthesia Care Procedure:                Pre-Anesthesia Assessment:                           - Prior to the procedure, a History and Physical                            was performed, and patient medications and                            allergies were reviewed. The patient's tolerance of                            previous anesthesia was also reviewed. The risks                            and benefits of the procedure and the sedation                            options and risks were discussed with the patient.                            All questions were answered, and informed consent                            was obtained. Prior Anticoagulants: The patient has                            taken no previous anticoagulant or antiplatelet                            agents. ASA Grade Assessment: II - A patient with                            mild systemic disease. After reviewing the risks                            and benefits, the patient was deemed in                            satisfactory condition to undergo the procedure.                           After obtaining informed consent, the endoscope was  passed under direct vision. Throughout the                            procedure, the patient's blood pressure, pulse, and                            oxygen saturations were monitored continuously. The                            Endoscope was introduced through the mouth, and                            advanced to the second part of duodenum. The upper                            GI endoscopy was  accomplished without difficulty.                            The patient tolerated the procedure well. Scope In: Scope Out: Findings:                 One benign-appearing, intrinsic moderate stenosis                            was found at the gastroesophageal junction. This                            stenosis measured 1.7 cm (inner diameter) x less                            than one cm (in length). The stenosis was                            traversed. A TTS dilator was passed through the                            scope. Dilation with an 18-19-20 mm balloon dilator                            was performed to 20 mm. The dilation site was                            examined and showed mild mucosal disruption.                            Estimated blood loss was minimal.                           LA Grade A (one or more mucosal breaks less than 5                            mm, not extending between tops of 2 mucosal folds)  esophagitis with no bleeding was found in the                            distal esophagus.                           A single diminutive sessile polyp was found in the                            gastric fundus. The polyp was removed with a cold                            biopsy forceps. Resection and retrieval were                            complete. Verification of patient identification                            for the specimen was done. Estimated blood loss was                            minimal.                           The exam was otherwise without abnormality.                           The cardia and gastric fundus were normal on                            retroflexion. Complications:            No immediate complications. Estimated Blood Loss:     Estimated blood loss was minimal. Impression:               - Benign-appearing esophageal stenosis. Dilated.                           - LA Grade A reflux esophagitis.                            - A single gastric polyp. Resected and retrieved.                           - The examination was otherwise normal. Recommendation:           - Patient has a contact number available for                            emergencies. The signs and symptoms of potential                            delayed complications were discussed with the                            patient. Return to normal activities tomorrow.  Written discharge instructions were provided to the                            patient.                           - Clear liquids x 1 hour then soft foods rest of                            day. Start prior diet tomorrow.                           - Continue present medications.                           - Await pathology results.                           - Follow an antireflux regimen.                           - Use Protonix (pantoprazole) 20 mg PO daily. Gatha Mayer, MD 12/04/2018 11:29:55 AM This report has been signed electronically.

## 2018-12-04 NOTE — Progress Notes (Signed)
Called to room to assist during endoscopic procedure.  Patient ID and intended procedure confirmed with present staff. Received instructions for my participation in the procedure from the performing physician.  

## 2018-12-08 ENCOUNTER — Telehealth: Payer: Self-pay

## 2018-12-08 NOTE — Telephone Encounter (Signed)
  Follow up Call-  Call back number 12/04/2018  Post procedure Call Back phone  # [phone]  Permission to leave phone message Yes  Some recent data might be hidden     Patient questions:  Do you have a fever, pain , or abdominal swelling? No. Pain Score  0 *  Have you tolerated food without any problems? Yes.    Have you been able to return to your normal activities? Yes.    Do you have any questions about your discharge instructions: Diet   No. Medications  No. Follow up visit  No.  Do you have questions or concerns about your Care? No.  Actions: * If pain score is 4 or above: No action needed, pain <4. 1. Have you developed a fever since your procedure? no  2.   Have you had an respiratory symptoms (SOB or cough) since your procedure? no  3.   Have you tested positive for COVID 19 since your procedure no  4.   Have you had any family members/close contacts diagnosed with the COVID 19 since your procedure?  no   If yes to any of these questions please route to Joylene John, RN and Alphonsa Gin, Therapist, sports.

## 2018-12-15 ENCOUNTER — Encounter: Payer: Self-pay | Admitting: Internal Medicine

## 2018-12-15 DIAGNOSIS — Z86010 Personal history of colonic polyps: Secondary | ICD-10-CM

## 2018-12-15 DIAGNOSIS — Z860101 Personal history of adenomatous and serrated colon polyps: Secondary | ICD-10-CM

## 2018-12-15 HISTORY — DX: Personal history of colonic polyps: Z86.010

## 2018-12-15 HISTORY — DX: Personal history of adenomatous and serrated colon polyps: Z86.0101

## 2018-12-15 NOTE — Progress Notes (Signed)
Letter to patient via my chart 1-2 diminutive colon adenomas Fundic gland polyps,   2027 colon recall

## 2019-01-12 MED FILL — ROSUVASTATIN CALCIUM 5 MG T: 5 | 90 days supply | Qty: 90 | Fill #1

## 2019-01-12 MED FILL — TELMISARTAN 80 MG TABS: 80 | 90 days supply | Qty: 90 | Fill #1

## 2019-01-12 MED FILL — AMLODIPINE 2.5 MG TABLET: 2.5 | 90 days supply | Qty: 90 | Fill #1

## 2019-03-02 MED FILL — PANTOPRAZOLE SOD DR 20 MG T: 20 | 90 days supply | Qty: 90 | Fill #1

## 2019-03-02 MED FILL — metFORMIN HCL 500 MG TABS: 500 | 30 days supply | Qty: 60 | Fill #1

## 2019-03-09 DIAGNOSIS — L820 Inflamed seborrheic keratosis: Secondary | ICD-10-CM | POA: Diagnosis not present

## 2019-03-09 DIAGNOSIS — D485 Neoplasm of uncertain behavior of skin: Secondary | ICD-10-CM | POA: Diagnosis not present

## 2019-03-09 DIAGNOSIS — L905 Scar conditions and fibrosis of skin: Secondary | ICD-10-CM | POA: Diagnosis not present

## 2019-03-09 DIAGNOSIS — D225 Melanocytic nevi of trunk: Secondary | ICD-10-CM | POA: Diagnosis not present

## 2019-03-09 DIAGNOSIS — D1801 Hemangioma of skin and subcutaneous tissue: Secondary | ICD-10-CM | POA: Diagnosis not present

## 2019-03-09 DIAGNOSIS — Z85820 Personal history of malignant melanoma of skin: Secondary | ICD-10-CM | POA: Diagnosis not present

## 2019-03-09 DIAGNOSIS — L821 Other seborrheic keratosis: Secondary | ICD-10-CM | POA: Diagnosis not present

## 2019-04-07 MED FILL — AMLODIPINE 2.5 MG TABLET: 2.5 | 90 days supply | Qty: 90 | Fill #0

## 2019-04-07 MED FILL — TELMISARTAN 80 MG TABLET: 80 | 90 days supply | Qty: 90 | Fill #0

## 2019-04-07 MED FILL — ROSUVASTATIN CALCIUM 5 MG T: 5 | 90 days supply | Qty: 90 | Fill #0

## 2019-04-28 MED FILL — metFORMIN HCL 500 MG TABS: 500 | 30 days supply | Qty: 60 | Fill #2

## 2019-05-14 DIAGNOSIS — E119 Type 2 diabetes mellitus without complications: Secondary | ICD-10-CM | POA: Diagnosis not present

## 2019-05-14 DIAGNOSIS — M859 Disorder of bone density and structure, unspecified: Secondary | ICD-10-CM | POA: Diagnosis not present

## 2019-05-14 DIAGNOSIS — Z0000 Encounter for general adult medical examination without abnormal findings: Secondary | ICD-10-CM | POA: Diagnosis not present

## 2019-05-14 DIAGNOSIS — E7849 Other hyperlipidemia: Secondary | ICD-10-CM | POA: Diagnosis not present

## 2019-05-21 DIAGNOSIS — K635 Polyp of colon: Secondary | ICD-10-CM | POA: Diagnosis not present

## 2019-05-21 DIAGNOSIS — Z1339 Encounter for screening examination for other mental health and behavioral disorders: Secondary | ICD-10-CM | POA: Diagnosis not present

## 2019-05-21 DIAGNOSIS — E785 Hyperlipidemia, unspecified: Secondary | ICD-10-CM | POA: Diagnosis not present

## 2019-05-21 DIAGNOSIS — K222 Esophageal obstruction: Secondary | ICD-10-CM | POA: Insufficient documentation

## 2019-05-21 DIAGNOSIS — L821 Other seborrheic keratosis: Secondary | ICD-10-CM | POA: Diagnosis not present

## 2019-05-21 DIAGNOSIS — E119 Type 2 diabetes mellitus without complications: Secondary | ICD-10-CM | POA: Diagnosis not present

## 2019-05-21 DIAGNOSIS — Z0000 Encounter for general adult medical examination without abnormal findings: Secondary | ICD-10-CM | POA: Insufficient documentation

## 2019-05-21 DIAGNOSIS — M1990 Unspecified osteoarthritis, unspecified site: Secondary | ICD-10-CM | POA: Diagnosis not present

## 2019-05-21 DIAGNOSIS — I878 Other specified disorders of veins: Secondary | ICD-10-CM | POA: Diagnosis not present

## 2019-05-21 DIAGNOSIS — Z85820 Personal history of malignant melanoma of skin: Secondary | ICD-10-CM | POA: Diagnosis not present

## 2019-05-21 DIAGNOSIS — K5730 Diverticulosis of large intestine without perforation or abscess without bleeding: Secondary | ICD-10-CM | POA: Diagnosis not present

## 2019-05-21 DIAGNOSIS — Z1331 Encounter for screening for depression: Secondary | ICD-10-CM | POA: Diagnosis not present

## 2019-05-21 MED FILL — JARDIANCE 10 MG TABLET: 10 | 30 days supply | Qty: 30 | Fill #0

## 2019-05-22 DIAGNOSIS — Z1212 Encounter for screening for malignant neoplasm of rectum: Secondary | ICD-10-CM | POA: Diagnosis not present

## 2019-05-28 MED FILL — PANTOPRAZOLE SOD DR 20 MG T: 20 | 90 days supply | Qty: 90 | Fill #2

## 2019-06-09 MED FILL — valACYclovir HCL 1 GM TABS: 1 | 10 days supply | Qty: 30 | Fill #0

## 2019-06-09 MED FILL — GABAPENTIN 100 MG CAPSULE: 100 | 10 days supply | Qty: 30 | Fill #0

## 2019-06-16 ENCOUNTER — Other Ambulatory Visit: Payer: Self-pay

## 2019-06-16 ENCOUNTER — Other Ambulatory Visit: Payer: Self-pay | Admitting: Obstetrics and Gynecology

## 2019-06-16 DIAGNOSIS — Z1231 Encounter for screening mammogram for malignant neoplasm of breast: Secondary | ICD-10-CM

## 2019-06-22 MED FILL — JARDIANCE 10 MG TABLET: 10 | 30 days supply | Qty: 30 | Fill #1

## 2019-06-22 MED FILL — metFORMIN HCL 500 MG TABS: 500 | 30 days supply | Qty: 60 | Fill #3

## 2019-07-24 MED FILL — JARDIANCE 10 MG TABLET: 10 | 30 days supply | Qty: 30 | Fill #2

## 2019-07-29 DIAGNOSIS — Z124 Encounter for screening for malignant neoplasm of cervix: Secondary | ICD-10-CM | POA: Diagnosis not present

## 2019-07-29 DIAGNOSIS — R8290 Unspecified abnormal findings in urine: Secondary | ICD-10-CM | POA: Diagnosis not present

## 2019-07-29 DIAGNOSIS — E669 Obesity, unspecified: Secondary | ICD-10-CM | POA: Diagnosis not present

## 2019-07-29 DIAGNOSIS — Z01419 Encounter for gynecological examination (general) (routine) without abnormal findings: Secondary | ICD-10-CM | POA: Diagnosis not present

## 2019-07-29 DIAGNOSIS — Z1389 Encounter for screening for other disorder: Secondary | ICD-10-CM | POA: Diagnosis not present

## 2019-07-29 DIAGNOSIS — Z130 Encounter for screening for diseases of the blood and blood-forming organs and certain disorders involving the immune mechanism: Secondary | ICD-10-CM | POA: Diagnosis not present

## 2019-07-29 DIAGNOSIS — Z6833 Body mass index (BMI) 33.0-33.9, adult: Secondary | ICD-10-CM | POA: Diagnosis not present

## 2019-08-01 MED FILL — AMLODIPINE 2.5 MG TABLET: 2.5 | 90 days supply | Qty: 90 | Fill #1

## 2019-08-04 ENCOUNTER — Ambulatory Visit
Admission: RE | Admit: 2019-08-04 | Discharge: 2019-08-04 | Disposition: A | Discharge status: Home / Self Care | Payer: 59 | Source: Ambulatory Visit | Attending: Obstetrics and Gynecology | Admitting: Obstetrics and Gynecology

## 2019-08-04 ENCOUNTER — Other Ambulatory Visit: Payer: Self-pay

## 2019-08-04 DIAGNOSIS — Z1231 Encounter for screening mammogram for malignant neoplasm of breast: Secondary | ICD-10-CM

## 2019-08-12 DIAGNOSIS — E119 Type 2 diabetes mellitus without complications: Secondary | ICD-10-CM | POA: Diagnosis not present

## 2019-08-25 ENCOUNTER — Ambulatory Visit: Payer: 59 | Admitting: Hematology & Oncology

## 2019-08-25 ENCOUNTER — Other Ambulatory Visit: Payer: 59

## 2019-08-28 MED FILL — PANTOPRAZOLE SOD DR 20 MG T: 20 | 90 days supply | Qty: 90 | Fill #3

## 2019-08-28 MED FILL — JARDIANCE 10 MG TABLET: 10 | 30 days supply | Qty: 30 | Fill #3

## 2019-09-01 ENCOUNTER — Other Ambulatory Visit: Payer: Self-pay | Admitting: *Deleted

## 2019-09-01 DIAGNOSIS — D708 Other neutropenia: Secondary | ICD-10-CM

## 2019-09-02 ENCOUNTER — Inpatient Hospital Stay: Payer: 59 | Attending: Hematology & Oncology

## 2019-09-02 ENCOUNTER — Telehealth: Payer: Self-pay | Admitting: Hematology & Oncology

## 2019-09-02 ENCOUNTER — Inpatient Hospital Stay (HOSPITAL_BASED_OUTPATIENT_CLINIC_OR_DEPARTMENT_OTHER): Payer: 59 | Admitting: Hematology & Oncology

## 2019-09-02 ENCOUNTER — Encounter: Payer: Self-pay | Admitting: Hematology & Oncology

## 2019-09-02 ENCOUNTER — Other Ambulatory Visit: Payer: Self-pay

## 2019-09-02 VITALS — BP 153/77 | HR 89 | Temp 98.2°F | Resp 18 | Wt 191.0 lb

## 2019-09-02 DIAGNOSIS — D708 Other neutropenia: Secondary | ICD-10-CM

## 2019-09-02 DIAGNOSIS — Z79899 Other long term (current) drug therapy: Secondary | ICD-10-CM | POA: Insufficient documentation

## 2019-09-02 DIAGNOSIS — D709 Neutropenia, unspecified: Secondary | ICD-10-CM | POA: Insufficient documentation

## 2019-09-02 DIAGNOSIS — Z7984 Long term (current) use of oral hypoglycemic drugs: Secondary | ICD-10-CM | POA: Diagnosis not present

## 2019-09-02 LAB — CBC WITH DIFFERENTIAL (CANCER CENTER ONLY)
Abs Immature Granulocytes: 0.02 10*3/uL (ref 0.00–0.07)
Basophils Absolute: 0 10*3/uL (ref 0.0–0.1)
Basophils Relative: 1 %
Eosinophils Absolute: 0.1 10*3/uL (ref 0.0–0.5)
Eosinophils Relative: 4 %
HCT: 42.4 % (ref 36.0–46.0)
Hemoglobin: 14 g/dL (ref 12.0–15.0)
Immature Granulocytes: 1 %
Lymphocytes Relative: 54 %
Lymphs Abs: 1.2 10*3/uL (ref 0.7–4.0)
MCH: 30.5 pg (ref 26.0–34.0)
MCHC: 33 g/dL (ref 30.0–36.0)
MCV: 92.4 fL (ref 80.0–100.0)
Monocytes Absolute: 0.6 10*3/uL (ref 0.1–1.0)
Monocytes Relative: 24 %
Neutro Abs: 0.4 10*3/uL — CL (ref 1.7–7.7)
Neutrophils Relative %: 16 %
Platelet Count: 320 10*3/uL (ref 150–400)
RBC: 4.59 MIL/uL (ref 3.87–5.11)
RDW: 12 % (ref 11.5–15.5)
WBC Count: 2.3 10*3/uL — ABNORMAL LOW (ref 4.0–10.5)
nRBC: 0 % (ref 0.0–0.2)

## 2019-09-02 LAB — CMP (CANCER CENTER ONLY)
ALT: 21 U/L (ref 0–44)
AST: 13 U/L — ABNORMAL LOW (ref 15–41)
Albumin: 4.8 g/dL (ref 3.5–5.0)
Alkaline Phosphatase: 60 U/L (ref 38–126)
Anion gap: 7 (ref 5–15)
BUN: 11 mg/dL (ref 8–23)
CO2: 31 mmol/L (ref 22–32)
Calcium: 9.7 mg/dL (ref 8.9–10.3)
Chloride: 103 mmol/L (ref 98–111)
Creatinine: 0.74 mg/dL (ref 0.44–1.00)
GFR, Est AFR Am: >60 mL/min (ref >60)
GFR, Estimated: >60 mL/min (ref >60)
Glucose, Bld: 155 mg/dL — ABNORMAL HIGH (ref 70–99)
Potassium: 4.1 mmol/L (ref 3.5–5.1)
Sodium: 141 mmol/L (ref 135–145)
Total Bilirubin: 0.5 mg/dL (ref 0.3–1.2)
Total Protein: 6.9 g/dL (ref 6.5–8.1)

## 2019-09-02 LAB — LACTATE DEHYDROGENASE: LDH: 88 U/L — ABNORMAL LOW (ref 98–192)

## 2019-09-02 LAB — SAVE SMEAR(SSMR), FOR PROVIDER SLIDE REVIEW

## 2019-09-02 NOTE — Telephone Encounter (Signed)
Appointments scheduled calendar printed per 7/7 los °

## 2019-09-02 NOTE — Progress Notes (Signed)
Hematology and Oncology Follow Up Visit  Jasmine Castro [id number] February 07, 1956 64 y.o. 09/02/2019   Principle Diagnosis:   Autoimmune neutropenia  Current Therapy:    Observation     Interim History:  Ms.  Castro is back for followup.  It has been about a year since we last saw her.  Thankfully, she has really had no problems herself.  Despite the profound neutropenia, she has had no problem with infections.  Her son had the liver transplant last year.  He was able to move out of their house about a month ago.  She now has to take care of her parents.  They live close by.  I think her mother recently fell and broke her ankle.  She is still working for [employer] system.  She might try to retire in a couple years.  She has had no problem with rashes.  She had the coronavirus vaccine back in January.  She has had no change in bowel or bladder habits.  She has had no headache.  There is been no mouth sores.  She has had no leg swelling.  There is been no bleeding.  Overall, her performance status is ECOG 1.  Medications:  Current Outpatient Medications:  .  amLODipine (NORVASC) 2.5 MG tablet, Take 2.5 mg by mouth daily., Disp: , Rfl: 11 .  CALCIUM-VITAMIN D PO, Take 1 tablet by mouth daily., Disp: , Rfl:  .  cholecalciferol (VITAMIN D) 1000 UNITS tablet, Take 1,000 Units by mouth daily., Disp: , Rfl:  .  empagliflozin (JARDIANCE) 10 MG TABS tablet, Jardiance 10 mg tablet  TAKE 1 TABLET BY MOUTH ONCE DAILY., Disp: , Rfl:  .  metFORMIN (GLUCOPHAGE) 500 MG tablet, Take 500 mg by mouth daily. , Disp: , Rfl: 3 .  Multiple Vitamin (MULTIVITAMIN) capsule, Take 1 capsule by mouth daily., Disp: , Rfl:  .  rosuvastatin (CRESTOR) 5 MG tablet, Take 5 mg by mouth every morning., Disp: , Rfl:  .  telmisartan (MICARDIS) 80 MG tablet, Take 80 mg by mouth every morning. , Disp: , Rfl:  .  vitamin C (ASCORBIC ACID) 500 MG tablet, Take 1 tablet (500 mg total) by mouth daily., Disp: 30 tablet, Rfl: 0 .   vitamin E 400 UNIT capsule, Take 400 Units by mouth daily., Disp: , Rfl:  .  cycloSPORINE (RESTASIS) 0.05 % ophthalmic emulsion, Place 1 drop into both eyes as needed.  (Patient not taking: Reported on 09/02/2019), Disp: , Rfl:  .  ibuprofen (ADVIL,MOTRIN) 200 MG tablet, Take 200 mg by mouth daily as needed for headache or moderate pain. (Patient not taking: Reported on 09/02/2019), Disp: , Rfl:  .  pantoprazole (PROTONIX) 20 MG tablet, Take 1 tablet (20 mg total) by mouth daily before breakfast., Disp: 90 tablet, Rfl: 3  Allergies:  Allergies  Allergen Reactions  . Sulfa Antibiotics Anaphylaxis  . Sulfonamide Derivatives Swelling    SWELLING REACTION UNSPECIFIED     Past Medical History, Surgical history, Social history, and Family History were reviewed and updated.  Review of System:   Review of Systems  Constitutional: Negative.   HENT: Negative.   Eyes: Negative.   Respiratory: Negative.   Cardiovascular: Negative.   Gastrointestinal: Negative.   Genitourinary: Negative.   Musculoskeletal: Negative.   Skin: Negative.   Neurological: Negative.   Endo/Heme/Allergies: Negative.   Psychiatric/Behavioral: Negative.     Physical Exam: Physical Exam Vitals reviewed.  HENT:     Head: Normocephalic and atraumatic.  Eyes:  Pupils: Pupils are equal, round, and reactive to light.  Cardiovascular:     Rate and Rhythm: Normal rate and regular rhythm.     Heart sounds: Normal heart sounds.  Pulmonary:     Effort: Pulmonary effort is normal.     Breath sounds: Normal breath sounds.  Abdominal:     General: Bowel sounds are normal.     Palpations: Abdomen is soft.  Musculoskeletal:        General: No tenderness or deformity. Normal range of motion.     Cervical back: Normal range of motion.  Lymphadenopathy:     Cervical: No cervical adenopathy.  Skin:    General: Skin is warm and dry.     Findings: No erythema or rash.  Neurological:     Mental Status: She is alert and  oriented to person, place, and time.  Psychiatric:        Behavior: Behavior normal.        Thought Content: Thought content normal.        Judgment: Judgment normal.      Lab Results  Component Value Date   WBC 2.3 (L) 09/02/2019   HGB 14.0 09/02/2019   HCT 42.4 09/02/2019   MCV 92.4 09/02/2019   PLT 320 09/02/2019     Chemistry      Component Value Date/Time   NA 141 09/02/2019 0821      Component Value Date/Time   CALCIUM 9.7 09/02/2019 0821   ALKPHOS 60 09/02/2019 0821   AST 13 (L) 09/02/2019 0821   ALT 21 09/02/2019 0821   BILITOT 0.5 09/02/2019 0821      Impression and Plan: Jasmine Castro is 64 year old white female. She has chronic neutropenia. We have been following her now for a little over 7 years.  So far, she has had absolutely no problems with infections.  So far, everything looks stable under the microscope with her blood smear.  The white blood cells appear mature.  We will still plan to see her back in 1 year.   Volanda Napoleon, MD 7/7/20219:16 AM

## 2019-09-07 MED FILL — METFORMIN HCL 500 MG TABS: 500 | 30 days supply | Qty: 60 | Fill #1

## 2019-09-08 DIAGNOSIS — L821 Other seborrheic keratosis: Secondary | ICD-10-CM | POA: Diagnosis not present

## 2019-09-08 DIAGNOSIS — D1801 Hemangioma of skin and subcutaneous tissue: Secondary | ICD-10-CM | POA: Diagnosis not present

## 2019-09-08 DIAGNOSIS — D225 Melanocytic nevi of trunk: Secondary | ICD-10-CM | POA: Diagnosis not present

## 2019-09-08 DIAGNOSIS — Z85820 Personal history of malignant melanoma of skin: Secondary | ICD-10-CM | POA: Diagnosis not present

## 2019-09-08 DIAGNOSIS — L905 Scar conditions and fibrosis of skin: Secondary | ICD-10-CM | POA: Diagnosis not present

## 2019-09-08 DIAGNOSIS — L814 Other melanin hyperpigmentation: Secondary | ICD-10-CM | POA: Diagnosis not present

## 2019-09-08 DIAGNOSIS — L570 Actinic keratosis: Secondary | ICD-10-CM | POA: Diagnosis not present

## 2019-09-08 DIAGNOSIS — L918 Other hypertrophic disorders of the skin: Secondary | ICD-10-CM | POA: Diagnosis not present

## 2019-09-18 DIAGNOSIS — I10 Essential (primary) hypertension: Secondary | ICD-10-CM | POA: Diagnosis not present

## 2019-09-18 DIAGNOSIS — Z85820 Personal history of malignant melanoma of skin: Secondary | ICD-10-CM | POA: Diagnosis not present

## 2019-09-18 DIAGNOSIS — K635 Polyp of colon: Secondary | ICD-10-CM | POA: Diagnosis not present

## 2019-09-18 DIAGNOSIS — K222 Esophageal obstruction: Secondary | ICD-10-CM | POA: Diagnosis not present

## 2019-09-18 DIAGNOSIS — E119 Type 2 diabetes mellitus without complications: Secondary | ICD-10-CM | POA: Diagnosis not present

## 2019-09-18 DIAGNOSIS — M1990 Unspecified osteoarthritis, unspecified site: Secondary | ICD-10-CM | POA: Diagnosis not present

## 2019-09-18 DIAGNOSIS — D72819 Decreased white blood cell count, unspecified: Secondary | ICD-10-CM | POA: Diagnosis not present

## 2019-09-18 DIAGNOSIS — E669 Obesity, unspecified: Secondary | ICD-10-CM | POA: Diagnosis not present

## 2019-09-25 MED FILL — TELMISARTAN 80 MG TABS: 80 | 90 days supply | Qty: 90 | Fill #2

## 2019-09-28 MED FILL — JARDIANCE 10 MG TABLET: 10 | 30 days supply | Qty: 30 | Fill #4

## 2019-10-05 DIAGNOSIS — Z20828 Contact with and (suspected) exposure to other viral communicable diseases: Secondary | ICD-10-CM | POA: Diagnosis not present

## 2019-10-12 MED FILL — METFORMIN HCL 500 MG TABS: 500 | 30 days supply | Qty: 60 | Fill #2

## 2019-10-12 MED FILL — ROSUVASTATIN CALCIUM 5 MG T: 5 | 90 days supply | Qty: 90 | Fill #2

## 2019-10-27 MED FILL — AMLODIPINE 2.5 MG TABLET: 2.5 | 90 days supply | Qty: 90 | Fill #2

## 2019-10-27 MED FILL — JARDIANCE 10 MG TABLET: 10 | 30 days supply | Qty: 30 | Fill #5

## 2019-11-17 MED FILL — METFORMIN HCL 500 MG TABS: 500 | 30 days supply | Qty: 60 | Fill #3

## 2019-11-23 ENCOUNTER — Other Ambulatory Visit: Payer: Self-pay | Admitting: Internal Medicine

## 2019-11-23 MED FILL — JARDIANCE 10 MG TABLET: 10 | 30 days supply | Qty: 30 | Fill #6

## 2019-11-23 MED FILL — PANTOPRAZOLE SOD DR 20 MG T: 20 | 90 days supply | Qty: 90 | Fill #0

## 2019-12-14 ENCOUNTER — Other Ambulatory Visit (HOSPITAL_COMMUNITY): Payer: Self-pay | Admitting: Internal Medicine

## 2019-12-14 MED FILL — METFORMIN HCL 500 MG TABS: 500 | 30 days supply | Qty: 60 | Fill #0

## 2020-01-05 ENCOUNTER — Other Ambulatory Visit (HOSPITAL_COMMUNITY): Payer: Self-pay | Admitting: Internal Medicine

## 2020-01-05 MED FILL — JARDIANCE 10 MG TABLET: 10 | 30 days supply | Qty: 30 | Fill #0

## 2020-01-05 MED FILL — TELMISARTAN 80 MG TABS: 80 | 90 days supply | Qty: 90 | Fill #0

## 2020-01-12 ENCOUNTER — Other Ambulatory Visit (HOSPITAL_COMMUNITY): Payer: Self-pay | Admitting: Internal Medicine

## 2020-01-12 MED FILL — ROSUVASTATIN CALCIUM 5 MG T: 5 | 90 days supply | Qty: 90 | Fill #0

## 2020-01-14 DIAGNOSIS — D485 Neoplasm of uncertain behavior of skin: Secondary | ICD-10-CM | POA: Diagnosis not present

## 2020-01-14 DIAGNOSIS — B078 Other viral warts: Secondary | ICD-10-CM | POA: Diagnosis not present

## 2020-01-14 DIAGNOSIS — L820 Inflamed seborrheic keratosis: Secondary | ICD-10-CM | POA: Diagnosis not present

## 2020-01-19 DIAGNOSIS — E785 Hyperlipidemia, unspecified: Secondary | ICD-10-CM | POA: Diagnosis not present

## 2020-01-19 DIAGNOSIS — K635 Polyp of colon: Secondary | ICD-10-CM | POA: Diagnosis not present

## 2020-01-19 DIAGNOSIS — I10 Essential (primary) hypertension: Secondary | ICD-10-CM | POA: Diagnosis not present

## 2020-01-19 DIAGNOSIS — E669 Obesity, unspecified: Secondary | ICD-10-CM | POA: Diagnosis not present

## 2020-01-19 DIAGNOSIS — M1990 Unspecified osteoarthritis, unspecified site: Secondary | ICD-10-CM | POA: Diagnosis not present

## 2020-01-19 DIAGNOSIS — E559 Vitamin D deficiency, unspecified: Secondary | ICD-10-CM | POA: Diagnosis not present

## 2020-01-19 DIAGNOSIS — Z85820 Personal history of malignant melanoma of skin: Secondary | ICD-10-CM | POA: Diagnosis not present

## 2020-01-19 DIAGNOSIS — E119 Type 2 diabetes mellitus without complications: Secondary | ICD-10-CM | POA: Diagnosis not present

## 2020-01-19 DIAGNOSIS — I872 Venous insufficiency (chronic) (peripheral): Secondary | ICD-10-CM | POA: Diagnosis not present

## 2020-01-22 ENCOUNTER — Other Ambulatory Visit (HOSPITAL_COMMUNITY): Payer: Self-pay | Admitting: Internal Medicine

## 2020-01-22 MED FILL — AMLODIPINE 2.5 MG TABLET: 2.5 | 90 days supply | Qty: 90 | Fill #0

## 2020-01-22 MED FILL — METFORMIN HCL 500 MG TABS: 500 | 30 days supply | Qty: 60 | Fill #1

## 2020-01-29 MED FILL — JARDIANCE 10 MG TABLET: 10 | 30 days supply | Qty: 30 | Fill #1

## 2020-02-08 MED FILL — PANTOPRAZOLE SOD DR 20 MG T: 20 | 90 days supply | Qty: 90 | Fill #1

## 2020-02-29 MED FILL — JARDIANCE 10 MG TABLET: 10 | 30 days supply | Qty: 30 | Fill #2

## 2020-02-29 MED FILL — METFORMIN HCL 500 MG TABS: 500 | 30 days supply | Qty: 60 | Fill #2

## 2020-03-31 MED FILL — TELMISARTAN 80 MG TABS: 80 | 90 days supply | Qty: 90 | Fill #1

## 2020-03-31 MED FILL — METFORMIN HCL 500 MG TABS: 500 | 30 days supply | Qty: 60 | Fill #3

## 2020-03-31 MED FILL — JARDIANCE 10 MG TABLET: 10 | 30 days supply | Qty: 30 | Fill #3

## 2020-03-31 MED FILL — ROSUVASTATIN CALCIUM 5 MG T: 5 | 90 days supply | Qty: 90 | Fill #1

## 2020-04-28 MED FILL — AMLODIPINE 2.5 MG TABLET: 2.5 | 90 days supply | Qty: 90 | Fill #1

## 2020-05-03 ENCOUNTER — Other Ambulatory Visit (HOSPITAL_COMMUNITY): Payer: Self-pay | Admitting: Pharmacist

## 2020-05-03 MED FILL — CARESTART COVID-19 HOME TES: 4 days supply | Qty: 4 | Fill #0

## 2020-05-16 MED FILL — PANTOPRAZOLE SOD DR 20 MG T: 20 | 90 days supply | Qty: 90 | Fill #2

## 2020-05-16 MED FILL — METFORMIN HCL 500 MG TABS: 500 | 30 days supply | Qty: 60 | Fill #4

## 2020-05-19 DIAGNOSIS — Z0000 Encounter for general adult medical examination without abnormal findings: Secondary | ICD-10-CM | POA: Diagnosis not present

## 2020-05-19 DIAGNOSIS — E785 Hyperlipidemia, unspecified: Secondary | ICD-10-CM | POA: Diagnosis not present

## 2020-05-19 DIAGNOSIS — E119 Type 2 diabetes mellitus without complications: Secondary | ICD-10-CM | POA: Diagnosis not present

## 2020-05-19 DIAGNOSIS — E559 Vitamin D deficiency, unspecified: Secondary | ICD-10-CM | POA: Diagnosis not present

## 2020-05-19 MED FILL — JARDIANCE 10 MG TABLET: 10 | 30 days supply | Qty: 30 | Fill #4

## 2020-05-26 DIAGNOSIS — E785 Hyperlipidemia, unspecified: Secondary | ICD-10-CM | POA: Diagnosis not present

## 2020-05-26 DIAGNOSIS — M8580 Other specified disorders of bone density and structure, unspecified site: Secondary | ICD-10-CM | POA: Diagnosis not present

## 2020-05-26 DIAGNOSIS — Z85820 Personal history of malignant melanoma of skin: Secondary | ICD-10-CM | POA: Diagnosis not present

## 2020-05-26 DIAGNOSIS — Z1212 Encounter for screening for malignant neoplasm of rectum: Secondary | ICD-10-CM | POA: Diagnosis not present

## 2020-05-26 DIAGNOSIS — E559 Vitamin D deficiency, unspecified: Secondary | ICD-10-CM | POA: Diagnosis not present

## 2020-05-26 DIAGNOSIS — Z1339 Encounter for screening examination for other mental health and behavioral disorders: Secondary | ICD-10-CM | POA: Diagnosis not present

## 2020-05-26 DIAGNOSIS — Z0000 Encounter for general adult medical examination without abnormal findings: Secondary | ICD-10-CM | POA: Diagnosis not present

## 2020-05-26 DIAGNOSIS — E119 Type 2 diabetes mellitus without complications: Secondary | ICD-10-CM | POA: Diagnosis not present

## 2020-05-26 DIAGNOSIS — I10 Essential (primary) hypertension: Secondary | ICD-10-CM | POA: Diagnosis not present

## 2020-05-26 DIAGNOSIS — E669 Obesity, unspecified: Secondary | ICD-10-CM | POA: Diagnosis not present

## 2020-05-26 DIAGNOSIS — R82998 Other abnormal findings in urine: Secondary | ICD-10-CM | POA: Diagnosis not present

## 2020-05-26 DIAGNOSIS — Z1331 Encounter for screening for depression: Secondary | ICD-10-CM | POA: Diagnosis not present

## 2020-06-20 ENCOUNTER — Other Ambulatory Visit: Payer: Self-pay | Admitting: Obstetrics and Gynecology

## 2020-06-20 DIAGNOSIS — Z1231 Encounter for screening mammogram for malignant neoplasm of breast: Secondary | ICD-10-CM

## 2020-06-23 ENCOUNTER — Other Ambulatory Visit (HOSPITAL_COMMUNITY): Payer: Self-pay

## 2020-06-23 MED FILL — Rosuvastatin Calcium Tab 5 MG: ORAL | 90 days supply | Qty: 90 | Fill #0 | Status: CN

## 2020-06-23 MED FILL — Empagliflozin Tab 10 MG: ORAL | 90 days supply | Qty: 90 | Fill #0 | Status: CN

## 2020-06-23 MED FILL — Metformin HCl Tab 500 MG: ORAL | 30 days supply | Qty: 60 | Fill #0 | Status: CN

## 2020-06-28 ENCOUNTER — Other Ambulatory Visit (HOSPITAL_COMMUNITY): Payer: Self-pay

## 2020-07-01 ENCOUNTER — Other Ambulatory Visit (HOSPITAL_COMMUNITY): Payer: Self-pay

## 2020-07-06 ENCOUNTER — Other Ambulatory Visit (HOSPITAL_COMMUNITY): Payer: Self-pay

## 2020-07-06 MED FILL — Telmisartan Tab 80 MG: ORAL | 90 days supply | Qty: 90 | Fill #0 | Status: AC

## 2020-07-06 MED FILL — Rosuvastatin Calcium Tab 5 MG: ORAL | 90 days supply | Qty: 90 | Fill #0 | Status: AC

## 2020-07-06 MED FILL — Metformin HCl Tab 500 MG: ORAL | 30 days supply | Qty: 60 | Fill #0 | Status: AC

## 2020-07-06 MED FILL — Amlodipine Besylate Tab 2.5 MG (Base Equivalent): ORAL | 90 days supply | Qty: 90 | Fill #0 | Status: CN

## 2020-07-06 MED FILL — Pantoprazole Sodium EC Tab 20 MG (Base Equiv): ORAL | 90 days supply | Qty: 90 | Fill #0 | Status: CN

## 2020-07-06 MED FILL — Empagliflozin Tab 10 MG: ORAL | 90 days supply | Qty: 90 | Fill #0 | Status: AC

## 2020-07-07 ENCOUNTER — Other Ambulatory Visit (HOSPITAL_COMMUNITY): Payer: Self-pay

## 2020-07-11 ENCOUNTER — Other Ambulatory Visit (HOSPITAL_COMMUNITY): Payer: Self-pay

## 2020-07-19 ENCOUNTER — Other Ambulatory Visit (HOSPITAL_COMMUNITY): Payer: Self-pay

## 2020-08-01 ENCOUNTER — Other Ambulatory Visit (HOSPITAL_COMMUNITY): Payer: Self-pay

## 2020-08-01 DIAGNOSIS — E669 Obesity, unspecified: Secondary | ICD-10-CM | POA: Diagnosis not present

## 2020-08-01 DIAGNOSIS — K429 Umbilical hernia without obstruction or gangrene: Secondary | ICD-10-CM | POA: Diagnosis not present

## 2020-08-01 DIAGNOSIS — R8290 Unspecified abnormal findings in urine: Secondary | ICD-10-CM | POA: Diagnosis not present

## 2020-08-01 DIAGNOSIS — Z130 Encounter for screening for diseases of the blood and blood-forming organs and certain disorders involving the immune mechanism: Secondary | ICD-10-CM | POA: Diagnosis not present

## 2020-08-01 DIAGNOSIS — Z01419 Encounter for gynecological examination (general) (routine) without abnormal findings: Secondary | ICD-10-CM | POA: Diagnosis not present

## 2020-08-01 DIAGNOSIS — Z6833 Body mass index (BMI) 33.0-33.9, adult: Secondary | ICD-10-CM | POA: Diagnosis not present

## 2020-08-01 DIAGNOSIS — Z1389 Encounter for screening for other disorder: Secondary | ICD-10-CM | POA: Diagnosis not present

## 2020-08-01 MED FILL — Amlodipine Besylate Tab 2.5 MG (Base Equivalent): ORAL | 90 days supply | Qty: 90 | Fill #0 | Status: AC

## 2020-08-01 MED FILL — Metformin HCl Tab 500 MG: ORAL | 30 days supply | Qty: 60 | Fill #1 | Status: AC

## 2020-08-09 ENCOUNTER — Other Ambulatory Visit: Payer: Self-pay

## 2020-08-09 ENCOUNTER — Ambulatory Visit
Admission: RE | Admit: 2020-08-09 | Discharge: 2020-08-09 | Disposition: A | Discharge status: Home / Self Care | Payer: 59 | Source: Ambulatory Visit | Attending: Obstetrics and Gynecology | Admitting: Obstetrics and Gynecology

## 2020-08-09 DIAGNOSIS — Z1231 Encounter for screening mammogram for malignant neoplasm of breast: Secondary | ICD-10-CM | POA: Diagnosis not present

## 2020-08-12 DIAGNOSIS — L820 Inflamed seborrheic keratosis: Secondary | ICD-10-CM | POA: Diagnosis not present

## 2020-08-12 DIAGNOSIS — D1801 Hemangioma of skin and subcutaneous tissue: Secondary | ICD-10-CM | POA: Diagnosis not present

## 2020-08-12 DIAGNOSIS — Z85820 Personal history of malignant melanoma of skin: Secondary | ICD-10-CM | POA: Diagnosis not present

## 2020-08-12 DIAGNOSIS — L821 Other seborrheic keratosis: Secondary | ICD-10-CM | POA: Diagnosis not present

## 2020-08-12 DIAGNOSIS — L814 Other melanin hyperpigmentation: Secondary | ICD-10-CM | POA: Diagnosis not present

## 2020-08-12 DIAGNOSIS — L905 Scar conditions and fibrosis of skin: Secondary | ICD-10-CM | POA: Diagnosis not present

## 2020-08-12 DIAGNOSIS — L304 Erythema intertrigo: Secondary | ICD-10-CM | POA: Diagnosis not present

## 2020-08-18 ENCOUNTER — Other Ambulatory Visit (HOSPITAL_COMMUNITY): Payer: Self-pay

## 2020-08-18 MED FILL — Pantoprazole Sodium EC Tab 20 MG (Base Equiv): ORAL | 90 days supply | Qty: 90 | Fill #0 | Status: AC

## 2020-09-01 ENCOUNTER — Inpatient Hospital Stay: Payer: 59 | Admitting: Hematology & Oncology

## 2020-09-01 ENCOUNTER — Inpatient Hospital Stay: Payer: 59

## 2020-09-12 ENCOUNTER — Other Ambulatory Visit: Payer: Self-pay | Admitting: *Deleted

## 2020-09-12 DIAGNOSIS — D709 Neutropenia, unspecified: Secondary | ICD-10-CM

## 2020-09-13 ENCOUNTER — Telehealth: Payer: Self-pay

## 2020-09-13 ENCOUNTER — Inpatient Hospital Stay (HOSPITAL_BASED_OUTPATIENT_CLINIC_OR_DEPARTMENT_OTHER): Payer: 59 | Admitting: Hematology & Oncology

## 2020-09-13 ENCOUNTER — Encounter: Payer: Self-pay | Admitting: Hematology & Oncology

## 2020-09-13 ENCOUNTER — Inpatient Hospital Stay: Payer: 59 | Attending: Hematology & Oncology

## 2020-09-13 ENCOUNTER — Other Ambulatory Visit: Payer: Self-pay

## 2020-09-13 VITALS — BP 143/61 | HR 76 | Temp 98.6°F | Resp 18 | Wt 186.0 lb

## 2020-09-13 DIAGNOSIS — Z7984 Long term (current) use of oral hypoglycemic drugs: Secondary | ICD-10-CM | POA: Insufficient documentation

## 2020-09-13 DIAGNOSIS — Z79899 Other long term (current) drug therapy: Secondary | ICD-10-CM | POA: Insufficient documentation

## 2020-09-13 DIAGNOSIS — D709 Neutropenia, unspecified: Secondary | ICD-10-CM | POA: Insufficient documentation

## 2020-09-13 DIAGNOSIS — Z944 Liver transplant status: Secondary | ICD-10-CM | POA: Insufficient documentation

## 2020-09-13 LAB — CBC WITH DIFFERENTIAL (CANCER CENTER ONLY)
Abs Immature Granulocytes: 0.01 10*3/uL (ref 0.00–0.07)
Basophils Absolute: 0 10*3/uL (ref 0.0–0.1)
Basophils Relative: 1 %
Eosinophils Absolute: 0.1 10*3/uL (ref 0.0–0.5)
Eosinophils Relative: 4 %
HCT: 40.8 % (ref 36.0–46.0)
Hemoglobin: 13.8 g/dL (ref 12.0–15.0)
Immature Granulocytes: 0 %
Lymphocytes Relative: 49 %
Lymphs Abs: 1.3 10*3/uL (ref 0.7–4.0)
MCH: 30.7 pg (ref 26.0–34.0)
MCHC: 33.8 g/dL (ref 30.0–36.0)
MCV: 90.9 fL (ref 80.0–100.0)
Monocytes Absolute: 0.6 10*3/uL (ref 0.1–1.0)
Monocytes Relative: 23 %
Neutro Abs: 0.6 10*3/uL — ABNORMAL LOW (ref 1.7–7.7)
Neutrophils Relative %: 23 %
Platelet Count: 269 10*3/uL (ref 150–400)
RBC: 4.49 MIL/uL (ref 3.87–5.11)
RDW: 12.5 % (ref 11.5–15.5)
WBC Count: 2.6 10*3/uL — ABNORMAL LOW (ref 4.0–10.5)
nRBC: 0 % (ref 0.0–0.2)

## 2020-09-13 LAB — COMPREHENSIVE METABOLIC PANEL
ALT: 23 U/L (ref 0–44)
AST: 20 U/L (ref 15–41)
Albumin: 4.5 g/dL (ref 3.5–5.0)
Alkaline Phosphatase: 62 U/L (ref 38–126)
Anion gap: 7 (ref 5–15)
BUN: 16 mg/dL (ref 8–23)
CO2: 28 mmol/L (ref 22–32)
Calcium: 9 mg/dL (ref 8.9–10.3)
Chloride: 103 mmol/L (ref 98–111)
Creatinine, Ser: 0.78 mg/dL (ref 0.44–1.00)
GFR, Estimated: >60 mL/min (ref >60)
Glucose, Bld: 156 mg/dL — ABNORMAL HIGH (ref 70–99)
Potassium: 4.4 mmol/L (ref 3.5–5.1)
Sodium: 138 mmol/L (ref 135–145)
Total Bilirubin: 0.6 mg/dL (ref 0.3–1.2)
Total Protein: 7.1 g/dL (ref 6.5–8.1)

## 2020-09-13 LAB — LACTATE DEHYDROGENASE: LDH: 77 U/L — ABNORMAL LOW (ref 98–192)

## 2020-09-13 LAB — SAVE SMEAR(SSMR), FOR PROVIDER SLIDE REVIEW

## 2020-09-13 NOTE — Telephone Encounter (Signed)
Appts made and printed for pt per 09/13/20 los  [employer]

## 2020-09-13 NOTE — Progress Notes (Signed)
Hematology and Oncology Follow Up Visit  Jasmine Castro [id number] Mar 16, 1955 65 y.o. 09/13/2020   Principle Diagnosis:  Autoimmune neutropenia  Current Therapy:   Observation     Interim History:  Ms.  Jasmine Castro is back for followup.  It has been about a year since we last saw her.  She is now retired.  She is grateful that she has been able to retire.  She is busy taking care of her parents.  Thankfully, they live right behind her.  This makes it a lot easier for her.  Her son is still having some issues.  He had a liver transplant a couple years ago.  He still having some issues with this.  He has had some recurrent infections.  I think he is being seen at the [facility] hospital.  Her health has been doing quite well.  She has had no problems with respect to her diabetes or high blood pressure.  She is trying to watch her diet.  She has had no issues with COVID.  She has had no problems with rashes.  There is been no infections.  Is been no change in bowel or bladder habits.  She had her mammogram a month ago.  Everything looked fine.  She has had no headache.  Overall, her performance status is ECOG 0.     Medications:  Current Outpatient Medications:    amLODipine (NORVASC) 2.5 MG tablet, TAKE 1 TABLET BY MOUTH ONCE DAILY., Disp: 90 tablet, Rfl: 2   CALCIUM-VITAMIN D PO, Take 1 tablet by mouth daily., Disp: , Rfl:    cholecalciferol (VITAMIN D) 1000 UNITS tablet, Take 1,000 Units by mouth daily., Disp: , Rfl:    cycloSPORINE (RESTASIS) 0.05 % ophthalmic emulsion, Place 1 drop into both eyes as needed., Disp: , Rfl:    empagliflozin (JARDIANCE) 10 MG TABS tablet, TAKE 1 TABLET BY MOUTH ONCE DAILY, Disp: 90 tablet, Rfl: 2   ibuprofen (ADVIL,MOTRIN) 200 MG tablet, Take 200 mg by mouth daily as needed for headache or moderate pain., Disp: , Rfl:    metFORMIN (GLUCOPHAGE) 500 MG tablet, TAKE 1 TABLET BY MOUTH 2 TIMES DAILY, Disp: 60 tablet, Rfl: 6   Multiple Vitamin (MULTIVITAMIN) capsule,  Take 1 capsule by mouth daily., Disp: , Rfl:    pantoprazole (PROTONIX) 20 MG tablet, TAKE 1 TABLET (20 MG TOTAL) BY MOUTH DAILY BEFORE BREAKFAST., Disp: 90 tablet, Rfl: 3   rosuvastatin (CRESTOR) 5 MG tablet, TAKE 1 TABLET BY MOUTH ONCE DAILY, Disp: 90 tablet, Rfl: 2   telmisartan (MICARDIS) 80 MG tablet, TAKE 1 TABLET BY MOUTH ONCE DAILY, Disp: 90 tablet, Rfl: 2   vitamin C (ASCORBIC ACID) 500 MG tablet, Take 1 tablet (500 mg total) by mouth daily., Disp: 30 tablet, Rfl: 0   vitamin E 400 UNIT capsule, Take 400 Units by mouth daily., Disp: , Rfl:    amLODipine (NORVASC) 2.5 MG tablet, Take 2.5 mg by mouth daily. (Patient not taking: Reported on 09/13/2020), Disp: , Rfl: 11   COVID-19 At Home Antigen Test KIT, USE AS DIRECTED WITHIN PACKAGE INSTRUCTIONS. (Patient not taking: Reported on 09/13/2020), Disp: 4 kit, Rfl: 0   empagliflozin (JARDIANCE) 10 MG TABS tablet, Jardiance 10 mg tablet  TAKE 1 TABLET BY MOUTH ONCE DAILY. (Patient not taking: Reported on 09/13/2020), Disp: , Rfl:    metFORMIN (GLUCOPHAGE) 500 MG tablet, Take 500 mg by mouth daily.  (Patient not taking: Reported on 09/13/2020), Disp: , Rfl: 3   rosuvastatin (CRESTOR) 5 MG  tablet, Take 5 mg by mouth every morning. (Patient not taking: Reported on 09/13/2020), Disp: , Rfl:    telmisartan (MICARDIS) 80 MG tablet, Take 80 mg by mouth every morning.  (Patient not taking: Reported on 09/13/2020), Disp: , Rfl:   Allergies:  Allergies  Allergen Reactions   Sulfa Antibiotics Anaphylaxis   Sulfonamide Derivatives Swelling    SWELLING REACTION UNSPECIFIED     Past Medical History, Surgical history, Social history, and Family History were reviewed and updated.  Review of System:   Review of Systems  Constitutional: Negative.   HENT: Negative.    Eyes: Negative.   Respiratory: Negative.    Cardiovascular: Negative.   Gastrointestinal: Negative.   Genitourinary: Negative.   Musculoskeletal: Negative.   Skin: Negative.   Neurological:  Negative.   Endo/Heme/Allergies: Negative.   Psychiatric/Behavioral: Negative.     Physical Exam: Physical Exam Vitals reviewed.  HENT:     Head: Normocephalic and atraumatic.  Eyes:     Pupils: Pupils are equal, round, and reactive to light.  Cardiovascular:     Rate and Rhythm: Normal rate and regular rhythm.     Heart sounds: Normal heart sounds.  Pulmonary:     Effort: Pulmonary effort is normal.     Breath sounds: Normal breath sounds.  Abdominal:     General: Bowel sounds are normal.     Palpations: Abdomen is soft.  Musculoskeletal:        General: No tenderness or deformity. Normal range of motion.     Cervical back: Normal range of motion.  Lymphadenopathy:     Cervical: No cervical adenopathy.  Skin:    General: Skin is warm and dry.     Findings: No erythema or rash.  Neurological:     Mental Status: She is alert and oriented to person, place, and time.  Psychiatric:        Behavior: Behavior normal.        Thought Content: Thought content normal.        Judgment: Judgment normal.     Lab Results  Component Value Date   WBC 2.6 (L) 09/13/2020   HGB 13.8 09/13/2020   HCT 40.8 09/13/2020   MCV 90.9 09/13/2020   PLT 269 09/13/2020     Chemistry      Component Value Date/Time   NA 138 09/13/2020 0755      Component Value Date/Time   CALCIUM 9.0 09/13/2020 0755   ALKPHOS 62 09/13/2020 0755   AST 20 09/13/2020 0755   AST 13 (L) 09/02/2019 0821   ALT 23 09/13/2020 0755   ALT 21 09/02/2019 0821   BILITOT 0.6 09/13/2020 0755   BILITOT 0.5 09/02/2019 0821      Impression and Plan: Jasmine Castro is 65 year old white female. She has chronic neutropenia. We have been following her now for a little over 8 years.  So far, she has had absolutely no problems with infections.  She has had no evidence of any progression to any type of myelodysplastic process.  I looked at her blood smear under the microscope.  Everything looked fine.  I did not see any immature  myeloid cells.  She has good maturation of her white blood cells.  There is no nucleated red blood cells.  Platelets are adequate in number and size.  We will still plan to see her back yearly.  She knows that she can was give [place] a call if she has any problems before 1 year.  Volanda Napoleon, MD 7/19/20228:50 AM

## 2020-09-16 ENCOUNTER — Telehealth: Payer: Self-pay | Admitting: *Deleted

## 2020-09-16 NOTE — Telephone Encounter (Signed)
Recd call from Jasmine Castro stating that she was exposed to Covid on Tuesday and is now experiencing fever (99.9), chills and extreme fatigue.  Has tested negative with a home test.  Dr Marin Olp notified.  Wants patient to get tested for Covid and if positive to contact primary care for additional treatment.  Patient understands and that is what she will do

## 2020-09-22 ENCOUNTER — Other Ambulatory Visit (HOSPITAL_COMMUNITY): Payer: Self-pay

## 2020-09-23 ENCOUNTER — Other Ambulatory Visit (HOSPITAL_COMMUNITY): Payer: Self-pay

## 2020-09-25 ENCOUNTER — Other Ambulatory Visit (HOSPITAL_COMMUNITY): Payer: Self-pay

## 2020-09-25 MED FILL — Empagliflozin Tab 10 MG: ORAL | 90 days supply | Qty: 90 | Fill #1 | Status: CN

## 2020-09-26 ENCOUNTER — Other Ambulatory Visit (HOSPITAL_COMMUNITY): Payer: Self-pay

## 2020-09-27 ENCOUNTER — Other Ambulatory Visit (HOSPITAL_COMMUNITY): Payer: Self-pay

## 2020-09-27 MED ORDER — METFORMIN HCL 500 MG PO TABS
500.0000 mg | ORAL_TABLET | Freq: Two times a day (BID) | ORAL | 3 refills | Status: DC
  Filled 2020-09-27: qty 180, 90d supply, fill #0
  Filled 2021-02-20: qty 180, 90d supply, fill #1
  Filled 2021-06-10: qty 180, 90d supply, fill #2
  Filled 2021-09-08: qty 180, 90d supply, fill #3

## 2020-09-27 MED ORDER — ROSUVASTATIN CALCIUM 5 MG PO TABS
5.0000 mg | ORAL_TABLET | Freq: Every day | ORAL | 2 refills | Status: DC
  Filled 2020-09-27: qty 90, 90d supply, fill #0

## 2020-09-27 MED ORDER — JARDIANCE 10 MG PO TABS
10.0000 mg | ORAL_TABLET | Freq: Every day | ORAL | 0 refills | Status: DC
  Filled 2020-09-27: qty 90, 90d supply, fill #0

## 2020-09-27 MED ORDER — JARDIANCE 10 MG PO TABS
10.0000 mg | ORAL_TABLET | Freq: Every day | ORAL | 2 refills | Status: AC
  Filled 2020-09-27: qty 90, 90d supply, fill #0

## 2020-09-27 MED ORDER — TELMISARTAN 80 MG PO TABS
80.0000 mg | ORAL_TABLET | Freq: Every day | ORAL | 0 refills | Status: DC
  Filled 2020-09-27: qty 90, 90d supply, fill #0

## 2020-09-27 MED ORDER — TELMISARTAN 80 MG PO TABS
80.0000 mg | ORAL_TABLET | Freq: Every day | ORAL | 2 refills | Status: AC
  Filled 2020-09-27: qty 90, 90d supply, fill #0

## 2020-09-27 MED ORDER — ROSUVASTATIN CALCIUM 5 MG PO TABS
5.0000 mg | ORAL_TABLET | Freq: Every day | ORAL | 0 refills | Status: DC
  Filled 2020-09-27: qty 90, 90d supply, fill #0

## 2020-09-27 MED ORDER — METFORMIN HCL 500 MG PO TABS
500.0000 mg | ORAL_TABLET | Freq: Two times a day (BID) | ORAL | 6 refills | Status: AC
  Filled 2020-09-27: qty 60, 30d supply, fill #0

## 2020-09-30 DIAGNOSIS — E785 Hyperlipidemia, unspecified: Secondary | ICD-10-CM | POA: Diagnosis not present

## 2020-09-30 DIAGNOSIS — M1990 Unspecified osteoarthritis, unspecified site: Secondary | ICD-10-CM | POA: Diagnosis not present

## 2020-09-30 DIAGNOSIS — I872 Venous insufficiency (chronic) (peripheral): Secondary | ICD-10-CM | POA: Diagnosis not present

## 2020-09-30 DIAGNOSIS — I10 Essential (primary) hypertension: Secondary | ICD-10-CM | POA: Diagnosis not present

## 2020-09-30 DIAGNOSIS — M8580 Other specified disorders of bone density and structure, unspecified site: Secondary | ICD-10-CM | POA: Diagnosis not present

## 2020-09-30 DIAGNOSIS — E669 Obesity, unspecified: Secondary | ICD-10-CM | POA: Diagnosis not present

## 2020-09-30 DIAGNOSIS — D72819 Decreased white blood cell count, unspecified: Secondary | ICD-10-CM | POA: Diagnosis not present

## 2020-09-30 DIAGNOSIS — E119 Type 2 diabetes mellitus without complications: Secondary | ICD-10-CM | POA: Diagnosis not present

## 2020-09-30 DIAGNOSIS — Z85820 Personal history of malignant melanoma of skin: Secondary | ICD-10-CM | POA: Diagnosis not present

## 2020-10-20 ENCOUNTER — Other Ambulatory Visit (HOSPITAL_COMMUNITY): Payer: Self-pay

## 2020-10-20 DIAGNOSIS — E119 Type 2 diabetes mellitus without complications: Secondary | ICD-10-CM | POA: Diagnosis not present

## 2020-10-20 MED ORDER — LOTEMAX SM 0.38 % OP GEL
1.0000 [drp] | Freq: Three times a day (TID) | OPHTHALMIC | 1 refills | Status: DC
  Filled 2020-10-20: qty 5, 30d supply, fill #0
  Filled 2020-11-12: qty 5, 30d supply, fill #1

## 2020-10-27 DIAGNOSIS — H168 Other keratitis: Secondary | ICD-10-CM | POA: Diagnosis not present

## 2020-10-31 ENCOUNTER — Other Ambulatory Visit (HOSPITAL_COMMUNITY): Payer: Self-pay

## 2020-11-01 ENCOUNTER — Other Ambulatory Visit (HOSPITAL_COMMUNITY): Payer: Self-pay

## 2020-11-01 MED ORDER — AMLODIPINE BESYLATE 2.5 MG PO TABS
2.5000 mg | ORAL_TABLET | Freq: Every day | ORAL | 3 refills | Status: DC
  Filled 2020-11-01: qty 90, 90d supply, fill #0
  Filled 2021-01-23: qty 90, 90d supply, fill #1
  Filled 2021-04-18: qty 90, 90d supply, fill #2
  Filled 2021-07-17: qty 90, 90d supply, fill #3

## 2020-11-13 ENCOUNTER — Other Ambulatory Visit: Payer: Self-pay | Admitting: Internal Medicine

## 2020-11-14 ENCOUNTER — Other Ambulatory Visit (HOSPITAL_COMMUNITY): Payer: Self-pay

## 2020-11-14 MED ORDER — PANTOPRAZOLE SODIUM 20 MG PO TBEC
20.0000 mg | DELAYED_RELEASE_TABLET | Freq: Every day | ORAL | 0 refills | Status: DC
  Filled 2020-11-14: qty 90, 90d supply, fill #0

## 2020-11-29 ENCOUNTER — Ambulatory Visit: Payer: 59 | Admitting: Orthopaedic Surgery

## 2020-11-29 ENCOUNTER — Ambulatory Visit: Payer: Self-pay

## 2020-11-29 ENCOUNTER — Encounter: Payer: Self-pay | Admitting: Orthopaedic Surgery

## 2020-11-29 DIAGNOSIS — M1711 Unilateral primary osteoarthritis, right knee: Secondary | ICD-10-CM | POA: Diagnosis not present

## 2020-11-29 DIAGNOSIS — G8929 Other chronic pain: Secondary | ICD-10-CM | POA: Diagnosis not present

## 2020-11-29 DIAGNOSIS — M25561 Pain in right knee: Secondary | ICD-10-CM | POA: Diagnosis not present

## 2020-11-29 MED ORDER — METHYLPREDNISOLONE ACETATE 40 MG/ML IJ SUSP
40.0000 mg | INTRAMUSCULAR | Status: AC | PRN
  Administered 2020-11-29: 40 mg via INTRA_ARTICULAR

## 2020-11-29 MED ORDER — LIDOCAINE HCL 1 % IJ SOLN
3.0000 mL | INTRAMUSCULAR | Status: AC | PRN
  Administered 2020-11-29: 3 mL

## 2020-11-29 NOTE — Progress Notes (Signed)
Office Visit Note   Patient: Jasmine Castro           Date of Birth: November 09, 1955           MRN: [id number] Visit Date: 11/29/2020              Requested by: Shon Baton, [address] [place],  [place] [address] PCP: Shon Baton, MD   Assessment & Plan: Visit Diagnoses:  1. Chronic pain of right knee   2. Unilateral primary osteoarthritis, right knee     Plan: I showed her her x-rays and went over her knee model and explained in detail that she does have significant arthritis in her right knee.  I showed her how to try quad training exercises twice daily that can help support the knee.  Also recommended steroid injection which she agreed to and tolerated well.  She understands at some point she may decide to have knee replacement surgery but to avoid that until conservative treatment measures fail.  All question concerns were answered and addressed.  Follow-up can be as needed.  Follow-Up Instructions: Return if symptoms worsen or fail to improve.   Orders:  Orders Placed This Encounter  Procedures   Large Joint Inj   XR Knee 1-2 Views Right   No orders of the defined types were placed in this encounter.     Procedures: Large Joint Inj: R knee on 11/29/2020 9:35 AM Indications: diagnostic evaluation and pain Details: 22 G 1.5 in needle, superolateral approach  Arthrogram: No  Medications: 3 mL lidocaine 1 %; 40 mg methylPREDNISolone acetate 40 MG/ML Outcome: tolerated well, no immediate complications Procedure, treatment alternatives, risks and benefits explained, specific risks discussed. Consent was given by the patient. Immediately prior to procedure a time out was called to verify the correct patient, procedure, equipment, support staff and site/side marked as required. Patient was prepped and draped in the usual sterile fashion.      Clinical Data: No additional findings.   Subjective: Chief Complaint  Patient presents with   Right Knee - Pain  The patient  is a 65 year old female that I am seeing for the first time sent from her primary care physician Dr. Shon Baton to evaluate and treat chronic right knee pain.  She has had knee pain for several years now.  She said it for started when she twisted her knee in the kitchen and felt pain in the medial aspect of her knee.  She does have pain with activities and some locking and catching on occasion.  The knee does swell on occasion as well.  She points to mainly medial joint line as source of her pain but there is some popping in the knee as well.  She is never had surgery on that right knee and never had an injection either.  She says her left knee is fine and asymptomatic.  She is not a diabetic.  HPI  Review of Systems There is currently no listed headache, chest pain, shortness of breath fever, chills, nausea, vomiting  Objective: Vital Signs: There were no vitals taken for this visit.  Physical Exam She is alert and orient x3 and in no acute distress Ortho Exam Examination of her left knee is normal.  Examination right knee does show some mild effusion.  There is patellofemoral crepitation and varus malalignment.  Her range of motion lacks full flexion by about 10 degrees and is less than her other side.  The knee feels unstable. Specialty Comments:  No specialty comments available.  Imaging: XR Knee 1-2 Views Right  Result Date: 11/29/2020 2 views of the right knee show severe tricompartment arthritis with complete loss of medial joint space and varus malalignment.  There are large osteophytes in all 3 compartments and a loose body.    PMFS History: Patient Active Problem List   Diagnosis Date Noted   Hx of adenomatous colonic polyps 12/15/2018   Obesity 04/28/2014   Essential hypertension, benign 04/28/2014   Hypercholesterolemia 04/28/2014   Leukopenia 01/31/2011   Hypertensive disorder 02/26/1998   Past Medical History:  Diagnosis Date   Anemia    Closed fracture of right  proximal radius 01/20/2016   Fundic gland polyps of stomach, benign    GERD with stricture    Hx of adenomatous colonic polyps 12/15/2018   Hyperlipidemia    Hypertension    Leukopenia 01/31/2011   Mastodynia 03/20/2011   PONV (postoperative nausea and vomiting)    Torus fracture of distal end of right radius     Family History  Problem Relation Age of Onset   Cancer Mother        breast   Breast cancer Mother 27   Heart disease Maternal Aunt    Heart disease Maternal Uncle    Heart disease Maternal Grandmother    Heart disease Maternal Grandfather    Colon cancer Neg Hx    Esophageal cancer Neg Hx     Past Surgical History:  Procedure Laterality Date   COLONOSCOPY  2010   ESOPHAGOGASTRODUODENOSCOPY  2010   ORIF ELBOW FRACTURE Right 01/20/2016   Procedure: OPEN REDUCTION INTERNAL FIXATION (ORIF) RIGHT ELBOW/OLECRANON FRACTURE;  Surgeon: Iran Planas, MD;  Location: [facility];  Service: Orthopedics;  Laterality: Right;   RADIAL HEAD ARTHROPLASTY Right 01/20/2016   Procedure: RIGHT RADIAL HEAD ARTHROPLASTY;  Surgeon: Iran Planas, MD;  Location: [facility];  Service: Orthopedics;  Laterality: Right;   TONSILLECTOMY     at age 46   [place]   Social History   Occupational History   Occupation: Librarian, academic: [employer]  Tobacco Use   Smoking status: Never   Smokeless tobacco: Never   Tobacco comments:    never used product  Vaping Use   Vaping Use: Never used  Substance and Sexual Activity   Alcohol use: No    Alcohol/week: 0.0 standard drinks   Drug use: No   Sexual activity: Yes    Birth control/protection: Surgical

## 2020-12-24 ENCOUNTER — Emergency Department (HOSPITAL_BASED_OUTPATIENT_CLINIC_OR_DEPARTMENT_OTHER): Payer: 59

## 2020-12-24 ENCOUNTER — Encounter (HOSPITAL_BASED_OUTPATIENT_CLINIC_OR_DEPARTMENT_OTHER): Payer: Self-pay | Admitting: *Deleted

## 2020-12-24 ENCOUNTER — Emergency Department (HOSPITAL_BASED_OUTPATIENT_CLINIC_OR_DEPARTMENT_OTHER)
Admission: EM | Admit: 2020-12-24 | Discharge: 2020-12-24 | Disposition: A | Discharge status: Home / Self Care | Payer: 59 | Attending: Emergency Medicine | Admitting: Emergency Medicine

## 2020-12-24 ENCOUNTER — Emergency Department (HOSPITAL_BASED_OUTPATIENT_CLINIC_OR_DEPARTMENT_OTHER): Payer: 59 | Admitting: Radiology

## 2020-12-24 ENCOUNTER — Other Ambulatory Visit: Payer: Self-pay

## 2020-12-24 DIAGNOSIS — S4991XA Unspecified injury of right shoulder and upper arm, initial encounter: Secondary | ICD-10-CM | POA: Diagnosis present

## 2020-12-24 DIAGNOSIS — Y92481 Parking lot as the place of occurrence of the external cause: Secondary | ICD-10-CM | POA: Insufficient documentation

## 2020-12-24 DIAGNOSIS — I10 Essential (primary) hypertension: Secondary | ICD-10-CM | POA: Diagnosis not present

## 2020-12-24 DIAGNOSIS — S0990XA Unspecified injury of head, initial encounter: Secondary | ICD-10-CM | POA: Diagnosis not present

## 2020-12-24 DIAGNOSIS — M25561 Pain in right knee: Secondary | ICD-10-CM | POA: Insufficient documentation

## 2020-12-24 DIAGNOSIS — S46911A Strain of unspecified muscle, fascia and tendon at shoulder and upper arm level, right arm, initial encounter: Secondary | ICD-10-CM | POA: Insufficient documentation

## 2020-12-24 DIAGNOSIS — W01198A Fall on same level from slipping, tripping and stumbling with subsequent striking against other object, initial encounter: Secondary | ICD-10-CM | POA: Insufficient documentation

## 2020-12-24 DIAGNOSIS — W19XXXA Unspecified fall, initial encounter: Secondary | ICD-10-CM

## 2020-12-24 DIAGNOSIS — R0789 Other chest pain: Secondary | ICD-10-CM | POA: Diagnosis not present

## 2020-12-24 DIAGNOSIS — M25511 Pain in right shoulder: Secondary | ICD-10-CM | POA: Diagnosis not present

## 2020-12-24 MED ORDER — DICLOFENAC SODIUM 1 % EX GEL: 2.0000 g | Freq: Four times a day (QID) | CUTANEOUS | 0 refills | Status: DC

## 2020-12-24 NOTE — ED Provider Notes (Signed)
Emergency Department Provider Note   I have reviewed the triage vital signs and the nursing notes.   HISTORY  Chief Complaint Fall and Head Injury   HPI Jasmine Castro is a 65 y.o. female with past medical history reviewed below presents to the emergency department evaluation after slip and fall while walking in a parking lot.  Patient states there is some grease in the ground she lost her footing falling mainly hitting her right side.  She did strike the back of her head but denies loss of consciousness.  She is hurting also in the right shoulder, right lateral chest, right knee especially.  She has chronic knee pain but the right one hurts more than normal.  Denies pain into the hip, lower back, abdomen.  No numbness or weakness.  Denies shortness of breath.  She is not anticoagulated.  Past Medical History:  Diagnosis Date   Anemia    Closed fracture of right proximal radius 01/20/2016   Fundic gland polyps of stomach, benign    GERD with stricture    Hx of adenomatous colonic polyps 12/15/2018   Hyperlipidemia    Hypertension    Leukopenia 01/31/2011   Mastodynia 03/20/2011   PONV (postoperative nausea and vomiting)    Torus fracture of distal end of right radius     Patient Active Problem List   Diagnosis Date Noted   Hx of adenomatous colonic polyps 12/15/2018   Obesity 04/28/2014   Essential hypertension, benign 04/28/2014   Hypercholesterolemia 04/28/2014   Leukopenia 01/31/2011   Hypertensive disorder 02/26/1998    Past Surgical History:  Procedure Laterality Date   COLONOSCOPY  2010   ESOPHAGOGASTRODUODENOSCOPY  2010   ORIF ELBOW FRACTURE Right 01/20/2016   Procedure: OPEN REDUCTION INTERNAL FIXATION (ORIF) RIGHT ELBOW/OLECRANON FRACTURE;  Surgeon: Iran Planas, MD;  Location: [facility];  Service: Orthopedics;  Laterality: Right;   RADIAL HEAD ARTHROPLASTY Right 01/20/2016   Procedure: RIGHT RADIAL HEAD ARTHROPLASTY;  Surgeon: Iran Planas, MD;  Location: [facility];   Service: Orthopedics;  Laterality: Right;   TONSILLECTOMY     at age 24   TUBAL LIGATION  1993    Allergies Sulfa antibiotics and Sulfonamide derivatives  Family History  Problem Relation Age of Onset   Cancer Mother        breast   Breast cancer Mother 52   Heart disease Maternal Aunt    Heart disease Maternal Uncle    Heart disease Maternal Grandmother    Heart disease Maternal Grandfather    Colon cancer Neg Hx    Esophageal cancer Neg Hx     Social History Social History   Tobacco Use   Smoking status: Never   Smokeless tobacco: Never   Tobacco comments:    never used product  Vaping Use   Vaping Use: Never used  Substance Use Topics   Alcohol use: No    Alcohol/week: 0.0 standard drinks   Drug use: No    Review of Systems  Constitutional: No fever/chills Eyes: No visual changes. ENT: No sore throat. Cardiovascular: Denies chest pain. Respiratory: Denies shortness of breath. Gastrointestinal: No abdominal pain.  No nausea, no vomiting.  No diarrhea.  No constipation. Genitourinary: Negative for dysuria. Musculoskeletal: Negative for back pain. Positive right shoulder and knee pain.  Skin: Negative for rash. Neurological: Negative for focal weakness or numbness. Positive HA.   10-point ROS otherwise negative.  ____________________________________________   PHYSICAL EXAM:  VITAL SIGNS: Vitals:   12/24/20 1204  BP:  139/74  Pulse: 79  Resp: 16  Temp: 98.3 F (36.8 C)  SpO2: 97%    Constitutional: Alert and oriented. Well appearing and in no acute distress. Eyes: Conjunctivae are normal.  Head: 5 cm scalp hematoma to the right occipital scalp. No laceration or abrasion.  Nose: No congestion/rhinnorhea. Mouth/Throat: Mucous membranes are moist.  Neck: No stridor. No cervical spine tenderness to palpation. Cardiovascular: Normal rate, regular rhythm. Good peripheral circulation. Grossly normal heart sounds.   Respiratory: Normal respiratory  effort.  No retractions. Lungs CTAB. Gastrointestinal: Soft and nontender. No distention.  Musculoskeletal: No lower extremity tenderness nor edema. No gross deformities of extremities.  Normal range of motion of the right shoulder with some pain with movement.  Mild tenderness to palpation of the right lateral chest. Normal ROM of the bilateral LEs. No knee effusion, bruising, or laceration.  Neurologic:  Normal speech and language. No gross focal neurologic deficits are appreciated.  Skin:  Skin is warm, dry and intact. No rash noted.   ____________________________________________  RADIOLOGY  DG Ribs Unilateral W/Chest Right  Result Date: 12/24/2020 CLINICAL DATA:  Acute chest and RIGHT rib pain following fall. Initial encounter. EXAM: RIGHT RIBS AND CHEST - 3+ VIEW COMPARISON:  None. FINDINGS: No fracture or other bone lesions are seen involving the ribs. There is no evidence of pneumothorax or pleural effusion. Both lungs are clear. Heart size and mediastinal contours are within normal limits. IMPRESSION: Negative. Electronically Signed   By: Margarette Canada M.D.   On: 12/24/2020 12:45   DG Shoulder Right  Result Date: 12/24/2020 CLINICAL DATA:  Acute RIGHT shoulder pain following fall. Initial encounter. EXAM: RIGHT SHOULDER - 3 VIEW COMPARISON:  None. FINDINGS: No acute fracture, subluxation or dislocation. Mild degenerative changes at the [facility] and glenohumeral joints identified. No focal bony lesions are present. No other significant abnormalities are noted. IMPRESSION: No acute abnormality. Electronically Signed   By: Margarette Canada M.D.   On: 12/24/2020 12:43   CT Head Wo Contrast  Result Date: 12/24/2020 CLINICAL DATA:  65 year old female with acute fall and head injury today. Initial encounter. EXAM: CT HEAD WITHOUT CONTRAST TECHNIQUE: Contiguous axial images were obtained from the base of the skull through the vertex without intravenous contrast. COMPARISON:  None. FINDINGS: Brain: No  evidence of acute infarction, hemorrhage, hydrocephalus, extra-axial collection or mass lesion/mass effect. Mild atrophy noted. Vascular: Mild carotid atherosclerotic calcifications are noted. Skull: Normal. Negative for fracture or focal lesion. Sinuses/Orbits: No acute abnormality Other: High posterior RIGHT scalp soft tissue swelling is noted. IMPRESSION: 1. No evidence of acute intracranial abnormality. 2. High posterior RIGHT scalp soft tissue swelling without fracture. 3. Mild atrophy. Electronically Signed   By: Margarette Canada M.D.   On: 12/24/2020 12:42   DG Knee Complete 4 Views Right  Result Date: 12/24/2020 CLINICAL DATA:  Acute RIGHT knee pain following fall today. Initial encounter. EXAM: RIGHT KNEE - COMPLETE 4+ VIEW COMPARISON:  11/29/2020 FINDINGS: No acute fracture or dislocation identified. No joint effusion is noted. Moderate to severe tricompartmental degenerative changes are again noted. A probable partially calcified loose body in the suprapatellar bursa is noted. IMPRESSION: No evidence of acute abnormality. Moderate to severe tricompartmental degenerative changes. Probable loose body in the suprapatellar bursa. Electronically Signed   By: Margarette Canada M.D.   On: 12/24/2020 12:47    ____________________________________________   PROCEDURES  Procedure(s) performed:   Procedures  None  ____________________________________________   INITIAL IMPRESSION / ASSESSMENT AND PLAN / ED COURSE  Pertinent labs & imaging results that were available during my care of the patient were reviewed by me and considered in my medical decision making (see chart for details).   Patient presents emergency department evaluation after mechanical fall.  She has a hematoma to the scalp and is not anticoagulated.  Plan for CT imaging of the head to evaluate for underlying fracture or bleed but lower suspicion for this clinically.  No tenderness to palpation along cervical, thoracic, lumbar spine.  Plan  for plain films and reevaluate.   Plain films are WNL. CT head without bleeding or fracture. Plan for supportive care plan at home and PCP follow up in the coming week.  ____________________________________________  FINAL CLINICAL IMPRESSION(S) / ED DIAGNOSES  Final diagnoses:  Fall, initial encounter  Injury of head, initial encounter  Strain of right shoulder, initial encounter  Acute pain of right knee     NEW OUTPATIENT MEDICATIONS STARTED DURING THIS VISIT:  Discharge Medication List as of 12/24/2020 12:53 PM     START taking these medications   Details  diclofenac Sodium (VOLTAREN) 1 % GEL Apply 2 g topically 4 (four) times daily., Starting Sat 12/24/2020, Normal        Note:  This document was prepared using Dragon voice recognition software and may include unintentional dictation errors.  Nanda Quinton, MD, [facility]    Evangeline Utley, [facility], MD 12/25/20 [phone]

## 2020-12-24 NOTE — ED Triage Notes (Signed)
Pt fell about an hour ago in parking lot after stepping in grease, landed on rt side, hit rt side of head. No Loc, Assisted up. Pain in rt post shoulder, rt upper arm, bil knees.

## 2020-12-24 NOTE — Discharge Instructions (Signed)

## 2021-01-02 ENCOUNTER — Other Ambulatory Visit (HOSPITAL_COMMUNITY): Payer: Self-pay

## 2021-01-02 MED ORDER — JARDIANCE 10 MG PO TABS
10.0000 mg | ORAL_TABLET | Freq: Every day | ORAL | 3 refills | Status: DC
  Filled 2021-01-02: qty 90, 90d supply, fill #0
  Filled 2021-04-06: qty 90, 90d supply, fill #1
  Filled 2021-07-17: qty 90, 90d supply, fill #2

## 2021-01-02 MED ORDER — ROSUVASTATIN CALCIUM 5 MG PO TABS
5.0000 mg | ORAL_TABLET | Freq: Every day | ORAL | 1 refills | Status: DC
  Filled 2021-01-02: qty 90, 90d supply, fill #0
  Filled 2021-04-06: qty 90, 90d supply, fill #1

## 2021-01-04 ENCOUNTER — Other Ambulatory Visit (HOSPITAL_COMMUNITY): Payer: Self-pay

## 2021-01-23 ENCOUNTER — Other Ambulatory Visit (HOSPITAL_COMMUNITY): Payer: Self-pay

## 2021-01-24 ENCOUNTER — Other Ambulatory Visit (HOSPITAL_COMMUNITY): Payer: Self-pay

## 2021-01-24 MED ORDER — TELMISARTAN 80 MG PO TABS
80.0000 mg | ORAL_TABLET | Freq: Every day | ORAL | 3 refills | Status: DC
  Filled 2021-01-24: qty 90, 90d supply, fill #0
  Filled 2021-04-18: qty 90, 90d supply, fill #1
  Filled 2021-07-17: qty 90, 90d supply, fill #2
  Filled 2021-09-29: qty 90, 90d supply, fill #3

## 2021-02-20 ENCOUNTER — Other Ambulatory Visit: Payer: Self-pay | Admitting: Internal Medicine

## 2021-02-21 ENCOUNTER — Other Ambulatory Visit (HOSPITAL_COMMUNITY): Payer: Self-pay

## 2021-02-21 MED ORDER — PANTOPRAZOLE SODIUM 20 MG PO TBEC
20.0000 mg | DELAYED_RELEASE_TABLET | Freq: Every day | ORAL | 0 refills | Status: DC
  Filled 2021-02-21: qty 90, 90d supply, fill #0

## 2021-03-14 DIAGNOSIS — M8589 Other specified disorders of bone density and structure, multiple sites: Secondary | ICD-10-CM | POA: Diagnosis not present

## 2021-03-14 DIAGNOSIS — N951 Menopausal and female climacteric states: Secondary | ICD-10-CM | POA: Diagnosis not present

## 2021-04-06 ENCOUNTER — Other Ambulatory Visit (HOSPITAL_COMMUNITY): Payer: Self-pay

## 2021-04-18 ENCOUNTER — Other Ambulatory Visit (HOSPITAL_COMMUNITY): Payer: Self-pay

## 2021-04-25 DIAGNOSIS — H1132 Conjunctival hemorrhage, left eye: Secondary | ICD-10-CM | POA: Diagnosis not present

## 2021-05-22 ENCOUNTER — Other Ambulatory Visit (HOSPITAL_COMMUNITY): Payer: Self-pay

## 2021-05-22 ENCOUNTER — Other Ambulatory Visit: Payer: Self-pay | Admitting: Internal Medicine

## 2021-05-22 MED ORDER — PANTOPRAZOLE SODIUM 20 MG PO TBEC
20.0000 mg | DELAYED_RELEASE_TABLET | Freq: Every day | ORAL | 3 refills | Status: DC
  Filled 2021-05-22: qty 90, 90d supply, fill #0
  Filled 2021-08-20: qty 90, 90d supply, fill #1

## 2021-05-29 ENCOUNTER — Other Ambulatory Visit: Payer: Self-pay | Admitting: Obstetrics and Gynecology

## 2021-05-29 DIAGNOSIS — Z1231 Encounter for screening mammogram for malignant neoplasm of breast: Secondary | ICD-10-CM

## 2021-06-09 DIAGNOSIS — E119 Type 2 diabetes mellitus without complications: Secondary | ICD-10-CM | POA: Diagnosis not present

## 2021-06-09 DIAGNOSIS — I10 Essential (primary) hypertension: Secondary | ICD-10-CM | POA: Diagnosis not present

## 2021-06-09 DIAGNOSIS — E559 Vitamin D deficiency, unspecified: Secondary | ICD-10-CM | POA: Diagnosis not present

## 2021-06-09 DIAGNOSIS — E785 Hyperlipidemia, unspecified: Secondary | ICD-10-CM | POA: Diagnosis not present

## 2021-06-12 ENCOUNTER — Other Ambulatory Visit (HOSPITAL_COMMUNITY): Payer: Self-pay

## 2021-06-15 DIAGNOSIS — E119 Type 2 diabetes mellitus without complications: Secondary | ICD-10-CM | POA: Diagnosis not present

## 2021-06-15 DIAGNOSIS — E669 Obesity, unspecified: Secondary | ICD-10-CM | POA: Diagnosis not present

## 2021-06-15 DIAGNOSIS — R82998 Other abnormal findings in urine: Secondary | ICD-10-CM | POA: Diagnosis not present

## 2021-06-15 DIAGNOSIS — M1990 Unspecified osteoarthritis, unspecified site: Secondary | ICD-10-CM | POA: Diagnosis not present

## 2021-06-15 DIAGNOSIS — Z1331 Encounter for screening for depression: Secondary | ICD-10-CM | POA: Diagnosis not present

## 2021-06-15 DIAGNOSIS — E559 Vitamin D deficiency, unspecified: Secondary | ICD-10-CM | POA: Diagnosis not present

## 2021-06-15 DIAGNOSIS — Z1389 Encounter for screening for other disorder: Secondary | ICD-10-CM | POA: Diagnosis not present

## 2021-06-15 DIAGNOSIS — E785 Hyperlipidemia, unspecified: Secondary | ICD-10-CM | POA: Diagnosis not present

## 2021-06-15 DIAGNOSIS — I872 Venous insufficiency (chronic) (peripheral): Secondary | ICD-10-CM | POA: Diagnosis not present

## 2021-06-15 DIAGNOSIS — I10 Essential (primary) hypertension: Secondary | ICD-10-CM | POA: Diagnosis not present

## 2021-06-15 DIAGNOSIS — Z85820 Personal history of malignant melanoma of skin: Secondary | ICD-10-CM | POA: Diagnosis not present

## 2021-06-15 DIAGNOSIS — Z0000 Encounter for general adult medical examination without abnormal findings: Secondary | ICD-10-CM | POA: Diagnosis not present

## 2021-07-01 ENCOUNTER — Other Ambulatory Visit (HOSPITAL_COMMUNITY): Payer: Self-pay

## 2021-07-03 ENCOUNTER — Other Ambulatory Visit (HOSPITAL_COMMUNITY): Payer: Self-pay

## 2021-07-03 MED ORDER — ROSUVASTATIN CALCIUM 5 MG PO TABS
5.0000 mg | ORAL_TABLET | Freq: Every day | ORAL | 2 refills | Status: DC
  Filled 2021-07-03: qty 90, 90d supply, fill #0
  Filled 2021-09-29: qty 90, 90d supply, fill #1
  Filled 2021-12-25: qty 30, 30d supply, fill #2
  Filled 2022-01-31: qty 30, 30d supply, fill #3
  Filled 2022-02-28: qty 30, 30d supply, fill #4

## 2021-07-17 ENCOUNTER — Other Ambulatory Visit (HOSPITAL_COMMUNITY): Payer: Self-pay

## 2021-08-09 ENCOUNTER — Other Ambulatory Visit (HOSPITAL_COMMUNITY): Payer: Self-pay

## 2021-08-09 DIAGNOSIS — E669 Obesity, unspecified: Secondary | ICD-10-CM | POA: Diagnosis not present

## 2021-08-09 DIAGNOSIS — N763 Subacute and chronic vulvitis: Secondary | ICD-10-CM | POA: Diagnosis not present

## 2021-08-09 DIAGNOSIS — Z1389 Encounter for screening for other disorder: Secondary | ICD-10-CM | POA: Diagnosis not present

## 2021-08-09 DIAGNOSIS — Z6833 Body mass index (BMI) 33.0-33.9, adult: Secondary | ICD-10-CM | POA: Diagnosis not present

## 2021-08-09 DIAGNOSIS — R8290 Unspecified abnormal findings in urine: Secondary | ICD-10-CM | POA: Diagnosis not present

## 2021-08-09 DIAGNOSIS — Z130 Encounter for screening for diseases of the blood and blood-forming organs and certain disorders involving the immune mechanism: Secondary | ICD-10-CM | POA: Diagnosis not present

## 2021-08-09 DIAGNOSIS — Z01419 Encounter for gynecological examination (general) (routine) without abnormal findings: Secondary | ICD-10-CM | POA: Diagnosis not present

## 2021-08-09 MED ORDER — NYSTATIN-TRIAMCINOLONE 100000-0.1 UNIT/GM-% EX OINT
TOPICAL_OINTMENT | Freq: Two times a day (BID) | CUTANEOUS | 1 refills | Status: DC
  Filled 2021-08-09: qty 30, 15d supply, fill #0
  Filled 2021-11-14: qty 30, 15d supply, fill #1

## 2021-08-10 ENCOUNTER — Other Ambulatory Visit: Payer: Self-pay

## 2021-08-10 ENCOUNTER — Ambulatory Visit
Admission: RE | Admit: 2021-08-10 | Discharge: 2021-08-10 | Disposition: A | Discharge status: Home / Self Care | Payer: 59 | Source: Ambulatory Visit | Attending: Obstetrics and Gynecology | Admitting: Obstetrics and Gynecology

## 2021-08-10 DIAGNOSIS — Z1231 Encounter for screening mammogram for malignant neoplasm of breast: Secondary | ICD-10-CM

## 2021-08-14 ENCOUNTER — Other Ambulatory Visit (HOSPITAL_COMMUNITY): Payer: Self-pay

## 2021-08-14 DIAGNOSIS — L821 Other seborrheic keratosis: Secondary | ICD-10-CM | POA: Diagnosis not present

## 2021-08-14 DIAGNOSIS — D225 Melanocytic nevi of trunk: Secondary | ICD-10-CM | POA: Diagnosis not present

## 2021-08-14 DIAGNOSIS — L718 Other rosacea: Secondary | ICD-10-CM | POA: Diagnosis not present

## 2021-08-14 DIAGNOSIS — L820 Inflamed seborrheic keratosis: Secondary | ICD-10-CM | POA: Diagnosis not present

## 2021-08-14 DIAGNOSIS — L814 Other melanin hyperpigmentation: Secondary | ICD-10-CM | POA: Diagnosis not present

## 2021-08-14 MED ORDER — METRONIDAZOLE 0.75 % EX CREA
1.0000 | TOPICAL_CREAM | Freq: Two times a day (BID) | CUTANEOUS | 2 refills | Status: AC
  Filled 2021-08-14: qty 45, 30d supply, fill #0
  Filled 2022-02-20: qty 45, 30d supply, fill #1
  Filled 2022-03-18: qty 45, 30d supply, fill #2

## 2021-08-21 ENCOUNTER — Other Ambulatory Visit (HOSPITAL_COMMUNITY): Payer: Self-pay

## 2021-09-08 ENCOUNTER — Other Ambulatory Visit (HOSPITAL_COMMUNITY): Payer: Self-pay

## 2021-09-13 ENCOUNTER — Inpatient Hospital Stay: Payer: 59 | Attending: Hematology & Oncology

## 2021-09-13 ENCOUNTER — Inpatient Hospital Stay: Payer: 59 | Admitting: Hematology & Oncology

## 2021-09-13 ENCOUNTER — Other Ambulatory Visit: Payer: Self-pay

## 2021-09-13 ENCOUNTER — Encounter: Payer: Self-pay | Admitting: Hematology & Oncology

## 2021-09-13 VITALS — BP 128/73 | HR 79 | Temp 98.1°F | Resp 18 | Ht 64.0 in | Wt 186.1 lb

## 2021-09-13 DIAGNOSIS — Z79899 Other long term (current) drug therapy: Secondary | ICD-10-CM | POA: Diagnosis not present

## 2021-09-13 DIAGNOSIS — D709 Neutropenia, unspecified: Secondary | ICD-10-CM | POA: Insufficient documentation

## 2021-09-13 DIAGNOSIS — Z7984 Long term (current) use of oral hypoglycemic drugs: Secondary | ICD-10-CM | POA: Diagnosis not present

## 2021-09-13 LAB — CBC WITH DIFFERENTIAL (CANCER CENTER ONLY)
Abs Immature Granulocytes: 0 10*3/uL (ref 0.00–0.07)
Basophils Absolute: 0 10*3/uL (ref 0.0–0.1)
Basophils Relative: 1 %
Eosinophils Absolute: 0.1 10*3/uL (ref 0.0–0.5)
Eosinophils Relative: 3 %
HCT: 43.7 % (ref 36.0–46.0)
Hemoglobin: 14.6 g/dL (ref 12.0–15.0)
Immature Granulocytes: 0 %
Lymphocytes Relative: 43 %
Lymphs Abs: 1.2 10*3/uL (ref 0.7–4.0)
MCH: 30.9 pg (ref 26.0–34.0)
MCHC: 33.4 g/dL (ref 30.0–36.0)
MCV: 92.6 fL (ref 80.0–100.0)
Monocytes Absolute: 0.7 10*3/uL (ref 0.1–1.0)
Monocytes Relative: 24 %
Neutro Abs: 0.8 10*3/uL — ABNORMAL LOW (ref 1.7–7.7)
Neutrophils Relative %: 29 %
Platelet Count: 297 10*3/uL (ref 150–400)
RBC: 4.72 MIL/uL (ref 3.87–5.11)
RDW: 12.2 % (ref 11.5–15.5)
WBC Count: 2.8 10*3/uL — ABNORMAL LOW (ref 4.0–10.5)
nRBC: 0 % (ref 0.0–0.2)

## 2021-09-13 LAB — CMP (CANCER CENTER ONLY)
ALT: 25 U/L (ref 0–44)
AST: 18 U/L (ref 15–41)
Albumin: 5 g/dL (ref 3.5–5.0)
Alkaline Phosphatase: 64 U/L (ref 38–126)
Anion gap: 8 (ref 5–15)
BUN: 16 mg/dL (ref 8–23)
CO2: 30 mmol/L (ref 22–32)
Calcium: 9.7 mg/dL (ref 8.9–10.3)
Chloride: 102 mmol/L (ref 98–111)
Creatinine: 0.77 mg/dL (ref 0.44–1.00)
GFR, Estimated: >60 mL/min (ref >60)
Glucose, Bld: 146 mg/dL — ABNORMAL HIGH (ref 70–99)
Potassium: 4.4 mmol/L (ref 3.5–5.1)
Sodium: 140 mmol/L (ref 135–145)
Total Bilirubin: 0.8 mg/dL (ref 0.3–1.2)
Total Protein: 7.1 g/dL (ref 6.5–8.1)

## 2021-09-13 LAB — SAVE SMEAR(SSMR), FOR PROVIDER SLIDE REVIEW

## 2021-09-13 LAB — LACTATE DEHYDROGENASE: LDH: 75 U/L — ABNORMAL LOW (ref 98–192)

## 2021-09-13 NOTE — Progress Notes (Signed)
Hematology and Oncology Follow Up Visit  Jasmine Castro [id number] 1955/08/21 66 y.o. 09/13/2021   Principle Diagnosis:  Autoimmune neutropenia  Current Therapy:   Observation     Interim History:  Ms.  Castro is back for followup.  We last saw her a year ago.  Since then, she has been doing pretty well.  There has been some issues in the family.  Her father passed away last summer.  As such, she been helping take care of her mom.  Her boy needed to have a lower leg amputated.  He just was having a nonhealing wound.  He also then had a car accident.  The good news is that he is getting married in October.  She is looking forward to this.  She has had problems with her right knee.  It sounds like she will need to have surgery for this.  She wants to try to avoid surgery until after the wedding.  Thankfully, she is now retired.  She is definitely busy during her retirement.  She at least, she has a time to be able to help her family.  [name].  She has had no change in bowel or bladder habits.  She has had no problems with cough or shortness of breath.  She has had no nausea or vomiting.  She is trying to watch her weight.  Overall, I would say performance as a probably ECOG 1.     Medications:  Current Outpatient Medications:    amLODipine (NORVASC) 2.5 MG tablet, TAKE 1 TABLET BY MOUTH ONCE DAILY., Disp: 90 tablet, Rfl: 3   cholecalciferol (VITAMIN D) 1000 UNITS tablet, Take 1,000 Units by mouth daily., Disp: , Rfl:    empagliflozin (JARDIANCE) 10 MG TABS tablet, Take 1 tablet (10 mg total) by mouth daily., Disp: 90 tablet, Rfl: 2   ibuprofen (ADVIL,MOTRIN) 200 MG tablet, Take 200 mg by mouth daily as needed for headache or moderate pain., Disp: , Rfl:    Loteprednol Etabonate (LOTEMAX SM) 0.38 % GEL, Place 1 drop into the right eye 3 (three) times daily., Disp: 5 g, Rfl: 1   metFORMIN (GLUCOPHAGE) 500 MG tablet, Take 500 mg by mouth daily.  (Patient not  taking: Reported on 09/13/2020), Disp: , Rfl: 3   metFORMIN (GLUCOPHAGE) 500 MG tablet, Take 1 tablet (500 mg total) by mouth 2 (two) times daily., Disp: 180 tablet, Rfl: 3   metFORMIN (GLUCOPHAGE) 500 MG tablet, Take 1 tablet (500 mg total) by mouth 2 (two) times daily., Disp: 60 tablet, Rfl: 6   metFORMIN (GLUCOPHAGE) 500 MG tablet, Take 1 tablet by mouth 2 (two) times daily., Disp: , Rfl:    metroNIDAZOLE (METROCREAM) 0.75 % cream, Apply 1 Application topically 2 (two) times daily to the face, Disp: 45 g, Rfl: 2   Multiple Vitamin (MULTIVITAMIN) capsule, Take 1 capsule by mouth daily., Disp: , Rfl:    nystatin-triamcinolone ointment (MYCOLOG), Apply topically to affected area 2 (two) times daily., Disp: 30 g, Rfl: 1   pantoprazole (PROTONIX) 20 MG tablet, Take 1 tablet (20 mg total) by mouth daily., Disp: 90 tablet, Rfl: 3   rosuvastatin (CRESTOR) 5 MG tablet, Take 5 mg by mouth every morning. (Patient not taking: Reported on 09/13/2020), Disp: , Rfl:    rosuvastatin (CRESTOR) 5 MG tablet, Take 1 tablet (5 mg total) by mouth daily., Disp: 90 tablet, Rfl: 2   rosuvastatin (CRESTOR) 5 MG tablet, Take 1 tablet (5 mg total) by  mouth daily., Disp: 90 tablet, Rfl: 2   telmisartan (MICARDIS) 80 MG tablet, Take 80 mg by mouth every morning.  (Patient not taking: Reported on 09/13/2020), Disp: , Rfl:    telmisartan (MICARDIS) 80 MG tablet, Take 1 tablet (80 mg total) by mouth daily., Disp: 90 tablet, Rfl: 2   telmisartan (MICARDIS) 80 MG tablet, Take 1 tablet (80 mg total) by mouth daily., Disp: 90 tablet, Rfl: 3   vitamin C (ASCORBIC ACID) 500 MG tablet, Take 1 tablet (500 mg total) by mouth daily., Disp: 30 tablet, Rfl: 0   vitamin E 400 UNIT capsule, Take 400 Units by mouth daily., Disp: , Rfl:   Allergies:  Allergies  Allergen Reactions   Sulfa Antibiotics Anaphylaxis and Swelling    Swelling of face,lips, mouth   Sulfonamide Derivatives Anaphylaxis and Swelling    Swelling of face, lips, and mouth     Past Medical History, Surgical history, Social history, and Family History were reviewed and updated.  Review of System:   Review of Systems  Constitutional: Negative.   HENT: Negative.    Eyes: Negative.   Respiratory: Negative.    Cardiovascular: Negative.   Gastrointestinal: Negative.   Genitourinary: Negative.   Musculoskeletal: Negative.   Skin: Negative.   Neurological: Negative.   Endo/Heme/Allergies: Negative.   Psychiatric/Behavioral: Negative.      Physical Exam: Physical Exam Vitals reviewed.  HENT:     Head: Normocephalic and atraumatic.  Eyes:     Pupils: Pupils are equal, round, and reactive to light.  Cardiovascular:     Rate and Rhythm: Normal rate and regular rhythm.     Heart sounds: Normal heart sounds.  Pulmonary:     Effort: Pulmonary effort is normal.     Breath sounds: Normal breath sounds.  Abdominal:     General: Bowel sounds are normal.     Palpations: Abdomen is soft.  Musculoskeletal:        General: No tenderness or deformity. Normal range of motion.     Cervical back: Normal range of motion.  Lymphadenopathy:     Cervical: No cervical adenopathy.  Skin:    General: Skin is warm and dry.     Findings: No erythema or rash.  Neurological:     Mental Status: She is alert and oriented to person, place, and time.  Psychiatric:        Behavior: Behavior normal.        Thought Content: Thought content normal.        Judgment: Judgment normal.      Lab Results  Component Value Date   WBC 2.8 (L) 09/13/2021   HGB 14.6 09/13/2021   HCT 43.7 09/13/2021   MCV 92.6 09/13/2021   PLT 297 09/13/2021     Chemistry      Component Value Date/Time   NA 138 09/13/2020 0755   K 4.4 09/13/2020 0755   CL 103 09/13/2020 0755   CO2 28 09/13/2020 0755   BUN 16 09/13/2020 0755   CREATININE 0.78 09/13/2020 0755   CREATININE 0.74 09/02/2019 0821      Component Value Date/Time   CALCIUM 9.0 09/13/2020 0755   ALKPHOS 62 09/13/2020 0755    AST 20 09/13/2020 0755   AST 13 (L) 09/02/2019 0821   ALT 23 09/13/2020 0755   ALT 21 09/02/2019 0821   BILITOT 0.6 09/13/2020 0755   BILITOT 0.5 09/02/2019 0821      Impression and Plan: Jasmine Castro is 66 year old white female.  She has chronic neutropenia. We have been following her now for a little over 9 years.  So far, she has had absolutely no problems with infections.  She has had no evidence of any progression to any type of myelodysplastic process.  I looked at her blood smear under the microscope.  Everything looked fine.  I did not see any immature myeloid cells.  She has good maturation of her white blood cells.  There is no nucleated red blood cells.  Platelets are adequate in number and size.  The only thing I worry about is if she needs surgery for the right knee.  I think she does need surgery, I would probably give her Neulasta before the procedure so that her white cell count is up enough to help minimize the risk of infections.  She will let me know if and when she has surgery.  I will plan to see her back in 1 year.    Volanda Napoleon, MD 7/19/20238:49 AM

## 2021-09-29 ENCOUNTER — Other Ambulatory Visit (HOSPITAL_COMMUNITY): Payer: Self-pay

## 2021-09-29 MED ORDER — AMLODIPINE BESYLATE 2.5 MG PO TABS
2.5000 mg | ORAL_TABLET | Freq: Every day | ORAL | 0 refills | Status: DC
  Filled 2021-09-29 – 2021-10-16 (×3): qty 90, 90d supply, fill #0

## 2021-10-02 ENCOUNTER — Other Ambulatory Visit (HOSPITAL_COMMUNITY): Payer: Self-pay

## 2021-10-17 ENCOUNTER — Other Ambulatory Visit (HOSPITAL_COMMUNITY): Payer: Self-pay

## 2021-10-23 DIAGNOSIS — E119 Type 2 diabetes mellitus without complications: Secondary | ICD-10-CM | POA: Diagnosis not present

## 2021-10-31 DIAGNOSIS — N907 Vulvar cyst: Secondary | ICD-10-CM | POA: Diagnosis not present

## 2021-11-14 ENCOUNTER — Other Ambulatory Visit (HOSPITAL_COMMUNITY): Payer: Self-pay

## 2021-11-14 MED ORDER — JARDIANCE 10 MG PO TABS
10.0000 mg | ORAL_TABLET | Freq: Every day | ORAL | 3 refills | Status: DC
  Filled 2021-11-14: qty 90, 90d supply, fill #0
  Filled 2022-02-11: qty 30, 30d supply, fill #1
  Filled 2022-03-14: qty 30, 30d supply, fill #2
  Filled 2022-04-13: qty 30, 30d supply, fill #3

## 2021-11-14 MED ORDER — PANTOPRAZOLE SODIUM 20 MG PO TBEC
20.0000 mg | DELAYED_RELEASE_TABLET | Freq: Every day | ORAL | 3 refills | Status: AC
  Filled 2021-11-14: qty 90, 90d supply, fill #0
  Filled 2022-02-12: qty 30, 30d supply, fill #1
  Filled 2022-03-14: qty 30, 30d supply, fill #2
  Filled 2022-04-13: qty 30, 30d supply, fill #3

## 2021-12-18 DIAGNOSIS — I872 Venous insufficiency (chronic) (peripheral): Secondary | ICD-10-CM | POA: Diagnosis not present

## 2021-12-18 DIAGNOSIS — Z85820 Personal history of malignant melanoma of skin: Secondary | ICD-10-CM | POA: Diagnosis not present

## 2021-12-18 DIAGNOSIS — D72819 Decreased white blood cell count, unspecified: Secondary | ICD-10-CM | POA: Diagnosis not present

## 2021-12-18 DIAGNOSIS — E119 Type 2 diabetes mellitus without complications: Secondary | ICD-10-CM | POA: Diagnosis not present

## 2021-12-18 DIAGNOSIS — M1990 Unspecified osteoarthritis, unspecified site: Secondary | ICD-10-CM | POA: Diagnosis not present

## 2021-12-18 DIAGNOSIS — E559 Vitamin D deficiency, unspecified: Secondary | ICD-10-CM | POA: Diagnosis not present

## 2021-12-18 DIAGNOSIS — E785 Hyperlipidemia, unspecified: Secondary | ICD-10-CM | POA: Diagnosis not present

## 2021-12-18 DIAGNOSIS — E669 Obesity, unspecified: Secondary | ICD-10-CM | POA: Diagnosis not present

## 2021-12-18 DIAGNOSIS — M8580 Other specified disorders of bone density and structure, unspecified site: Secondary | ICD-10-CM | POA: Diagnosis not present

## 2021-12-18 DIAGNOSIS — I10 Essential (primary) hypertension: Secondary | ICD-10-CM | POA: Diagnosis not present

## 2021-12-25 ENCOUNTER — Other Ambulatory Visit (HOSPITAL_COMMUNITY): Payer: Self-pay

## 2021-12-25 MED ORDER — TELMISARTAN 80 MG PO TABS
80.0000 mg | ORAL_TABLET | Freq: Every day | ORAL | 3 refills | Status: DC
  Filled 2021-12-25: qty 30, 30d supply, fill #0
  Filled 2022-01-19: qty 30, 30d supply, fill #1
  Filled 2022-03-23: qty 30, 30d supply, fill #2
  Filled 2022-04-23: qty 30, 30d supply, fill #3

## 2021-12-26 ENCOUNTER — Other Ambulatory Visit (HOSPITAL_COMMUNITY): Payer: Self-pay

## 2021-12-27 ENCOUNTER — Other Ambulatory Visit (HOSPITAL_COMMUNITY): Payer: Self-pay

## 2021-12-28 ENCOUNTER — Other Ambulatory Visit (HOSPITAL_COMMUNITY): Payer: Self-pay

## 2021-12-28 MED ORDER — METFORMIN HCL 500 MG PO TABS
500.0000 mg | ORAL_TABLET | Freq: Two times a day (BID) | ORAL | 3 refills | Status: DC
  Filled 2021-12-28: qty 60, 30d supply, fill #0
  Filled 2022-02-10: qty 60, 30d supply, fill #1
  Filled 2022-03-14: qty 60, 30d supply, fill #2
  Filled 2022-04-13: qty 60, 30d supply, fill #3

## 2022-01-15 ENCOUNTER — Other Ambulatory Visit (HOSPITAL_COMMUNITY): Payer: Self-pay

## 2022-01-15 MED ORDER — AMLODIPINE BESYLATE 2.5 MG PO TABS
2.5000 mg | ORAL_TABLET | Freq: Every day | ORAL | 3 refills | Status: DC
  Filled 2022-01-15: qty 30, 30d supply, fill #0
  Filled 2022-02-20: qty 30, 30d supply, fill #1
  Filled 2022-03-18: qty 30, 30d supply, fill #2
  Filled 2022-04-23: qty 30, 30d supply, fill #3

## 2022-01-19 ENCOUNTER — Other Ambulatory Visit (HOSPITAL_COMMUNITY): Payer: Self-pay

## 2022-01-31 ENCOUNTER — Other Ambulatory Visit (HOSPITAL_COMMUNITY): Payer: Self-pay

## 2022-02-12 ENCOUNTER — Other Ambulatory Visit (HOSPITAL_COMMUNITY): Payer: Self-pay

## 2022-02-13 ENCOUNTER — Other Ambulatory Visit: Payer: Self-pay

## 2022-02-20 ENCOUNTER — Other Ambulatory Visit: Payer: Self-pay

## 2022-02-20 ENCOUNTER — Other Ambulatory Visit (HOSPITAL_COMMUNITY): Payer: Self-pay

## 2022-02-21 ENCOUNTER — Other Ambulatory Visit (HOSPITAL_COMMUNITY): Payer: Self-pay

## 2022-02-21 MED ORDER — TELMISARTAN 80 MG PO TABS
80.0000 mg | ORAL_TABLET | Freq: Every day | ORAL | 3 refills | Status: DC
  Filled 2022-02-21: qty 30, 30d supply, fill #0

## 2022-03-27 ENCOUNTER — Other Ambulatory Visit (HOSPITAL_COMMUNITY): Payer: Self-pay

## 2022-03-27 MED ORDER — ROSUVASTATIN CALCIUM 5 MG PO TABS
5.0000 mg | ORAL_TABLET | Freq: Every day | ORAL | 3 refills | Status: DC
  Filled 2022-03-27: qty 30, 30d supply, fill #0
  Filled 2022-04-29: qty 30, 30d supply, fill #1

## 2022-03-28 ENCOUNTER — Other Ambulatory Visit (HOSPITAL_COMMUNITY): Payer: Self-pay

## 2022-05-01 ENCOUNTER — Other Ambulatory Visit: Payer: Self-pay

## 2022-05-09 ENCOUNTER — Other Ambulatory Visit: Payer: Self-pay | Admitting: Obstetrics and Gynecology

## 2022-05-09 DIAGNOSIS — Z1231 Encounter for screening mammogram for malignant neoplasm of breast: Secondary | ICD-10-CM

## 2022-06-12 DIAGNOSIS — E785 Hyperlipidemia, unspecified: Secondary | ICD-10-CM | POA: Diagnosis not present

## 2022-06-12 DIAGNOSIS — R002 Palpitations: Secondary | ICD-10-CM | POA: Diagnosis not present

## 2022-06-12 DIAGNOSIS — D72819 Decreased white blood cell count, unspecified: Secondary | ICD-10-CM | POA: Diagnosis not present

## 2022-06-12 DIAGNOSIS — E119 Type 2 diabetes mellitus without complications: Secondary | ICD-10-CM | POA: Diagnosis not present

## 2022-06-12 DIAGNOSIS — E559 Vitamin D deficiency, unspecified: Secondary | ICD-10-CM | POA: Diagnosis not present

## 2022-06-12 DIAGNOSIS — I10 Essential (primary) hypertension: Secondary | ICD-10-CM | POA: Diagnosis not present

## 2022-06-12 DIAGNOSIS — R7989 Other specified abnormal findings of blood chemistry: Secondary | ICD-10-CM | POA: Diagnosis not present

## 2022-06-18 DIAGNOSIS — Z1212 Encounter for screening for malignant neoplasm of rectum: Secondary | ICD-10-CM | POA: Diagnosis not present

## 2022-06-19 DIAGNOSIS — R82998 Other abnormal findings in urine: Secondary | ICD-10-CM | POA: Diagnosis not present

## 2022-06-19 DIAGNOSIS — E119 Type 2 diabetes mellitus without complications: Secondary | ICD-10-CM | POA: Diagnosis not present

## 2022-06-19 DIAGNOSIS — E559 Vitamin D deficiency, unspecified: Secondary | ICD-10-CM | POA: Diagnosis not present

## 2022-06-19 DIAGNOSIS — R002 Palpitations: Secondary | ICD-10-CM | POA: Diagnosis not present

## 2022-06-19 DIAGNOSIS — M1990 Unspecified osteoarthritis, unspecified site: Secondary | ICD-10-CM | POA: Diagnosis not present

## 2022-06-19 DIAGNOSIS — Z0000 Encounter for general adult medical examination without abnormal findings: Secondary | ICD-10-CM | POA: Diagnosis not present

## 2022-06-19 DIAGNOSIS — E785 Hyperlipidemia, unspecified: Secondary | ICD-10-CM | POA: Diagnosis not present

## 2022-06-19 DIAGNOSIS — Z85820 Personal history of malignant melanoma of skin: Secondary | ICD-10-CM | POA: Diagnosis not present

## 2022-06-19 DIAGNOSIS — I872 Venous insufficiency (chronic) (peripheral): Secondary | ICD-10-CM | POA: Diagnosis not present

## 2022-06-19 DIAGNOSIS — E669 Obesity, unspecified: Secondary | ICD-10-CM | POA: Diagnosis not present

## 2022-06-19 DIAGNOSIS — M8580 Other specified disorders of bone density and structure, unspecified site: Secondary | ICD-10-CM | POA: Diagnosis not present

## 2022-06-19 DIAGNOSIS — I10 Essential (primary) hypertension: Secondary | ICD-10-CM | POA: Diagnosis not present

## 2022-06-19 DIAGNOSIS — D72819 Decreased white blood cell count, unspecified: Secondary | ICD-10-CM | POA: Diagnosis not present

## 2022-08-13 ENCOUNTER — Other Ambulatory Visit (HOSPITAL_COMMUNITY): Payer: Self-pay

## 2022-08-13 DIAGNOSIS — Z124 Encounter for screening for malignant neoplasm of cervix: Secondary | ICD-10-CM | POA: Diagnosis not present

## 2022-08-13 DIAGNOSIS — Z01411 Encounter for gynecological examination (general) (routine) with abnormal findings: Secondary | ICD-10-CM | POA: Diagnosis not present

## 2022-08-13 DIAGNOSIS — N9089 Other specified noninflammatory disorders of vulva and perineum: Secondary | ICD-10-CM | POA: Diagnosis not present

## 2022-08-13 MED ORDER — NYSTATIN 100000 UNIT/GM EX OINT
TOPICAL_OINTMENT | CUTANEOUS | 0 refills | Status: AC
  Filled 2022-08-13: qty 30, 14d supply, fill #0
  Filled 2022-08-16: qty 30, 30d supply, fill #0

## 2022-08-13 MED ORDER — TRIAMCINOLONE ACETONIDE 0.1 % EX OINT
TOPICAL_OINTMENT | CUTANEOUS | 0 refills | Status: DC
  Filled 2022-08-13: qty 30, 14d supply, fill #0
  Filled 2022-08-16: qty 30, 15d supply, fill #0

## 2022-08-14 ENCOUNTER — Other Ambulatory Visit (HOSPITAL_COMMUNITY): Payer: Self-pay

## 2022-08-15 ENCOUNTER — Other Ambulatory Visit (HOSPITAL_COMMUNITY): Payer: Self-pay

## 2022-08-15 DIAGNOSIS — S70362A Insect bite (nonvenomous), left thigh, initial encounter: Secondary | ICD-10-CM | POA: Diagnosis not present

## 2022-08-15 DIAGNOSIS — B079 Viral wart, unspecified: Secondary | ICD-10-CM | POA: Diagnosis not present

## 2022-08-15 DIAGNOSIS — D225 Melanocytic nevi of trunk: Secondary | ICD-10-CM | POA: Diagnosis not present

## 2022-08-15 DIAGNOSIS — D485 Neoplasm of uncertain behavior of skin: Secondary | ICD-10-CM | POA: Diagnosis not present

## 2022-08-15 DIAGNOSIS — L298 Other pruritus: Secondary | ICD-10-CM | POA: Diagnosis not present

## 2022-08-15 DIAGNOSIS — L570 Actinic keratosis: Secondary | ICD-10-CM | POA: Diagnosis not present

## 2022-08-15 DIAGNOSIS — L820 Inflamed seborrheic keratosis: Secondary | ICD-10-CM | POA: Diagnosis not present

## 2022-08-15 DIAGNOSIS — L814 Other melanin hyperpigmentation: Secondary | ICD-10-CM | POA: Diagnosis not present

## 2022-08-15 DIAGNOSIS — L538 Other specified erythematous conditions: Secondary | ICD-10-CM | POA: Diagnosis not present

## 2022-08-15 DIAGNOSIS — L218 Other seborrheic dermatitis: Secondary | ICD-10-CM | POA: Diagnosis not present

## 2022-08-15 DIAGNOSIS — L718 Other rosacea: Secondary | ICD-10-CM | POA: Diagnosis not present

## 2022-08-15 DIAGNOSIS — L821 Other seborrheic keratosis: Secondary | ICD-10-CM | POA: Diagnosis not present

## 2022-08-15 MED ORDER — METRONIDAZOLE 0.75 % EX CREA
1.0000 | TOPICAL_CREAM | Freq: Two times a day (BID) | CUTANEOUS | 2 refills | Status: DC
  Filled 2022-08-15: qty 45, 30d supply, fill #0

## 2022-08-16 ENCOUNTER — Other Ambulatory Visit (HOSPITAL_COMMUNITY): Payer: Self-pay

## 2022-08-16 ENCOUNTER — Ambulatory Visit
Admission: RE | Admit: 2022-08-16 | Discharge: 2022-08-16 | Disposition: A | Discharge status: Home / Self Care | Payer: PPO | Source: Ambulatory Visit | Attending: Obstetrics and Gynecology | Admitting: Obstetrics and Gynecology

## 2022-08-16 DIAGNOSIS — Z1231 Encounter for screening mammogram for malignant neoplasm of breast: Secondary | ICD-10-CM | POA: Diagnosis not present

## 2022-08-21 ENCOUNTER — Other Ambulatory Visit (HOSPITAL_COMMUNITY): Payer: Self-pay

## 2022-08-28 ENCOUNTER — Other Ambulatory Visit (HOSPITAL_COMMUNITY): Payer: Self-pay

## 2022-08-28 DIAGNOSIS — T8189XA Other complications of procedures, not elsewhere classified, initial encounter: Secondary | ICD-10-CM | POA: Diagnosis not present

## 2022-08-28 DIAGNOSIS — L080 Pyoderma: Secondary | ICD-10-CM | POA: Diagnosis not present

## 2022-08-28 MED ORDER — MUPIROCIN 2 % EX OINT
1.0000 | TOPICAL_OINTMENT | Freq: Two times a day (BID) | CUTANEOUS | 1 refills | Status: DC
  Filled 2022-08-28: qty 22, 11d supply, fill #0
  Filled 2022-09-05: qty 22, 11d supply, fill #1

## 2022-08-28 MED ORDER — DOXYCYCLINE HYCLATE 100 MG PO CAPS
100.0000 mg | ORAL_CAPSULE | Freq: Two times a day (BID) | ORAL | 0 refills | Status: DC
  Filled 2022-08-28: qty 20, 10d supply, fill #0

## 2022-09-05 ENCOUNTER — Other Ambulatory Visit (HOSPITAL_COMMUNITY): Payer: Self-pay

## 2022-09-05 MED ORDER — DOXYCYCLINE HYCLATE 100 MG PO CAPS
100.0000 mg | ORAL_CAPSULE | Freq: Two times a day (BID) | ORAL | 0 refills | Status: DC
  Filled 2022-09-05: qty 20, 10d supply, fill #0

## 2022-09-06 ENCOUNTER — Other Ambulatory Visit (HOSPITAL_COMMUNITY): Payer: Self-pay

## 2022-09-12 ENCOUNTER — Inpatient Hospital Stay: Payer: PPO | Admitting: Family

## 2022-09-12 ENCOUNTER — Encounter: Payer: Self-pay | Admitting: Family

## 2022-09-12 ENCOUNTER — Inpatient Hospital Stay: Payer: PPO | Attending: Hematology & Oncology

## 2022-09-12 VITALS — BP 147/79 | HR 84 | Temp 98.1°F | Resp 17 | Wt 187.1 lb

## 2022-09-12 DIAGNOSIS — D709 Neutropenia, unspecified: Secondary | ICD-10-CM

## 2022-09-12 DIAGNOSIS — D708 Other neutropenia: Secondary | ICD-10-CM | POA: Insufficient documentation

## 2022-09-12 LAB — CMP (CANCER CENTER ONLY)
ALT: 28 U/L (ref 0–44)
AST: 18 U/L (ref 15–41)
Albumin: 4.7 g/dL (ref 3.5–5.0)
Alkaline Phosphatase: 56 U/L (ref 38–126)
Anion gap: 11 (ref 5–15)
BUN: 20 mg/dL (ref 8–23)
CO2: 29 mmol/L (ref 22–32)
Calcium: 9.8 mg/dL (ref 8.9–10.3)
Chloride: 101 mmol/L (ref 98–111)
Creatinine: 0.82 mg/dL (ref 0.44–1.00)
GFR, Estimated: >60 mL/min (ref >60)
Glucose, Bld: 142 mg/dL — ABNORMAL HIGH (ref 70–99)
Potassium: 4.3 mmol/L (ref 3.5–5.1)
Sodium: 141 mmol/L (ref 135–145)
Total Bilirubin: 0.6 mg/dL (ref 0.3–1.2)
Total Protein: 7.1 g/dL (ref 6.5–8.1)

## 2022-09-12 LAB — CBC WITH DIFFERENTIAL (CANCER CENTER ONLY)
Abs Immature Granulocytes: 0.03 10*3/uL (ref 0.00–0.07)
Basophils Absolute: 0 10*3/uL (ref 0.0–0.1)
Basophils Relative: 1 %
Eosinophils Absolute: 0.1 10*3/uL (ref 0.0–0.5)
Eosinophils Relative: 3 %
HCT: 40.9 % (ref 36.0–46.0)
Hemoglobin: 13.6 g/dL (ref 12.0–15.0)
Immature Granulocytes: 1 %
Lymphocytes Relative: 51 %
Lymphs Abs: 1.5 10*3/uL (ref 0.7–4.0)
MCH: 30.8 pg (ref 26.0–34.0)
MCHC: 33.3 g/dL (ref 30.0–36.0)
MCV: 92.5 fL (ref 80.0–100.0)
Monocytes Absolute: 0.7 10*3/uL (ref 0.1–1.0)
Monocytes Relative: 23 %
Neutro Abs: 0.6 10*3/uL — ABNORMAL LOW (ref 1.7–7.7)
Neutrophils Relative %: 21 %
Platelet Count: 287 10*3/uL (ref 150–400)
RBC: 4.42 MIL/uL (ref 3.87–5.11)
RDW: 11.9 % (ref 11.5–15.5)
WBC Count: 2.9 10*3/uL — ABNORMAL LOW (ref 4.0–10.5)
nRBC: 0 % (ref 0.0–0.2)

## 2022-09-12 LAB — LACTATE DEHYDROGENASE: LDH: 75 U/L — ABNORMAL LOW (ref 98–192)

## 2022-09-12 LAB — SAVE SMEAR(SSMR), FOR PROVIDER SLIDE REVIEW

## 2022-09-12 NOTE — Progress Notes (Signed)
Hematology and Oncology Follow Up Visit  Jasmine Castro [id number] 03-Jan-1956 67 y.o. 09/12/2022   Principle Diagnosis:  Autoimmune neutropenia   Current Therapy:        Observation   Interim History:  Jasmine Castro is here today for follow-up. She is doing quite well and has no complaints at this time.  She denies any issue with frequent or recurrent infections.  She has a lesion on her right lower leg that dermatology biopsied and is benign. She is currently on day 14 of 20 on Doxycycline and dressing is clean, dry and intact.  WBC count today is stable at 2.9, ANC 0.6.  No fever, chills, n/v, cough, rash, dizziness, SOB, chest pain, palpitations, abdominal pain or changes in bowel or bladder habits.  Minimal puffiness in the lower extremities unchanged from baseline.  No falls or syncope reported.  Appetite and hydration are good. Weight is stable at 187 lbs.   ECOG Performance Status: 1 - Symptomatic but completely ambulatory  Medications:  Allergies as of 09/12/2022       Reactions   Sulfa Antibiotics Anaphylaxis, Swelling   Swelling of face,lips, mouth   Sulfonamide Derivatives Anaphylaxis, Swelling   Swelling of face, lips, and mouth        Medication List        Accurate as of September 12, 2022  8:44 AM. If you have any questions, ask your nurse or doctor.          amLODipine 2.5 MG tablet Commonly known as: NORVASC Take 1 tablet (2.5 mg total) by mouth daily.   ascorbic acid 500 MG tablet Commonly known as: VITAMIN C Take 1 tablet (500 mg total) by mouth daily.   cholecalciferol 1000 units tablet Commonly known as: VITAMIN D Take 1,000 Units by mouth daily.   doxycycline 100 MG capsule Commonly known as: VIBRAMYCIN Take 1 capsule (100 mg total) by mouth 2 (two) times daily with food   ibuprofen 200 MG tablet Commonly known as: ADVIL Take 200 mg by mouth daily as needed for headache or moderate pain.   Jardiance 10 MG Tabs tablet Generic drug:  empagliflozin Take 1 tablet (10 mg total) by mouth daily.   Jardiance 10 MG Tabs tablet Generic drug: empagliflozin Take 1 tablet (10 mg total) by mouth daily.   metFORMIN 500 MG tablet Commonly known as: GLUCOPHAGE Take 1 tablet (500 mg total) by mouth 2 (two) times daily.   metFORMIN 500 MG tablet Commonly known as: GLUCOPHAGE Take 1 tablet (500 mg total) by mouth 2 (two) times daily as directed   metroNIDAZOLE 0.75 % cream Commonly known as: METROCREAM Apply 1 Application topically 2 (two) times daily to the face   metroNIDAZOLE 0.75 % cream Commonly known as: METROCREAM Apply 1 Application topically 2 (two) times daily to face   multivitamin capsule Take 1 capsule by mouth daily.   mupirocin ointment 2 % Commonly known as: BACTROBAN Apply 1 Application topically 2 (two) times daily to wound   nystatin ointment Commonly known as: MYCOSTATIN APPLY TO THE AFFECTED AREAS TWICE DAILY   nystatin-triamcinolone ointment Commonly known as: MYCOLOG Apply topically to affected area 2 (two) times daily.   pantoprazole 20 MG tablet Commonly known as: PROTONIX Take 1 tablet (20 mg total) by mouth daily.   rosuvastatin 5 MG tablet Commonly known as: CRESTOR Take 1 tablet (5 mg total) by mouth daily.   telmisartan 80 MG tablet Commonly known as: MICARDIS Take 1 tablet (80 mg total) by  mouth daily.   telmisartan 80 MG tablet Commonly known as: MICARDIS Take 1 tablet (80 mg total) by mouth daily.   telmisartan 80 MG tablet Commonly known as: MICARDIS Take 1 tablet (80 mg total) by mouth daily as directed   triamcinolone ointment 0.1 % Commonly known as: KENALOG APPLY A THIN LAYER TO THE AFFECTED AREAS TWICE DAILY   vitamin E 180 MG (400 UNITS) capsule Take 400 Units by mouth daily.        Allergies:  Allergies  Allergen Reactions   Sulfa Antibiotics Anaphylaxis and Swelling    Swelling of face,lips, mouth   Sulfonamide Derivatives Anaphylaxis and Swelling     Swelling of face, lips, and mouth    Past Medical History, Surgical history, Social history, and Family History were reviewed and updated.  Review of Systems: All other 10 point review of systems is negative.   Physical Exam:  vitals were not taken for this visit.   Wt Readings from Last 3 Encounters:  09/13/21 186 lb 1.9 oz (84.4 kg)  12/24/20 180 lb (81.6 kg)  09/13/20 186 lb (84.4 kg)    Ocular: Sclerae unicteric, pupils equal, round and reactive to light Ear-nose-throat: Oropharynx clear, dentition fair Lymphatic: No cervical or supraclavicular adenopathy Lungs no rales or rhonchi, good excursion bilaterally Heart regular rate and rhythm, no murmur appreciated Abd soft, nontender, positive bowel sounds MSK no focal spinal tenderness, no joint edema Neuro: non-focal, well-oriented, appropriate affect Breasts: Deferred   Lab Results  Component Value Date   WBC 2.9 (L) 09/12/2022   HGB 13.6 09/12/2022   HCT 40.9 09/12/2022   MCV 92.5 09/12/2022   PLT 287 09/12/2022   Lab Results  Component Value Date   FERRITIN 49.2 05/13/2008   IRON 58 05/13/2008   IRONPCTSAT 15.4 (L) 05/13/2008   Lab Results  Component Value Date   RBC 4.42 09/12/2022   No results found for: "KPAFRELGTCHN", "LAMBDASER", "KAPLAMBRATIO" No results found for: "IGGSERUM", "IGA", "IGMSERUM" No results found for: "TOTALPROTELP", "ALBUMINELP", "A1GS", "A2GS", "BETS", "BETA2SER", "GAMS", "MSPIKE", "SPEI"   Chemistry      Component Value Date/Time   NA 140 09/13/2021 0818   K 4.4 09/13/2021 0818   CL 102 09/13/2021 0818   CO2 30 09/13/2021 0818   BUN 16 09/13/2021 0818   CREATININE 0.77 09/13/2021 0818      Component Value Date/Time   CALCIUM 9.7 09/13/2021 0818   ALKPHOS 64 09/13/2021 0818   AST 18 09/13/2021 0818   ALT 25 09/13/2021 0818   BILITOT 0.8 09/13/2021 0818       Impression and Plan: Jasmine Castro is a very pleasant 67 yo caucasian female with long history of chronic  neutropenia.  Her counts reviewed with Dr. Myna Hidalgo. So far, they have remained stable and she is asymptomatic.  If she plans to have any type of surgical procedure we can give Neulasta beforehand.  Follow-up with MD in 1 year!  Eileen Stanford, NP 7/17/20248:44 AM

## 2022-09-17 ENCOUNTER — Telehealth: Payer: Self-pay | Admitting: Orthopaedic Surgery

## 2022-10-02 ENCOUNTER — Encounter: Payer: Self-pay | Admitting: Physician Assistant

## 2022-10-02 ENCOUNTER — Other Ambulatory Visit (INDEPENDENT_AMBULATORY_CARE_PROVIDER_SITE_OTHER): Payer: PPO

## 2022-10-02 ENCOUNTER — Ambulatory Visit: Payer: PPO | Admitting: Physician Assistant

## 2022-10-02 DIAGNOSIS — M25562 Pain in left knee: Secondary | ICD-10-CM | POA: Diagnosis not present

## 2022-10-02 MED ORDER — LIDOCAINE HCL 1 % IJ SOLN
3.0000 mL | INTRAMUSCULAR | Status: AC | PRN
Start: 2022-10-02 — End: 2022-10-02
  Administered 2022-10-02: 3 mL

## 2022-10-02 MED ORDER — METHYLPREDNISOLONE ACETATE 40 MG/ML IJ SUSP
40.0000 mg | INTRAMUSCULAR | Status: AC | PRN
Start: 2022-10-02 — End: 2022-10-02
  Administered 2022-10-02: 40 mg via INTRA_ARTICULAR

## 2022-10-02 NOTE — Progress Notes (Signed)
Office Visit Note   Patient: Jasmine Castro           Date of Birth: [birth year]           MRN: [id number] Visit Date: 10/02/2022              Requested by: Creola Corn, MD [address] [place],  [place] [address] PCP: Creola Corn, MD   Assessment & Plan: Visit Diagnoses:  1. Acute pain of left knee     Plan: She will work on Dance movement psychotherapist.  She understands to wait at least 3 months between cortisone injections.  Questions were encouraged and answered at length.  Follow-up as needed.  Follow-Up Instructions: Return if symptoms worsen or fail to improve.   Orders:  Orders Placed This Encounter  Procedures   Large Joint Inj   XR Knee 1-2 Views Left   No orders of the defined types were placed in this encounter.     Procedures: Large Joint Inj: L knee on 10/02/2022 4:39 PM Indications: pain Details: 22 G 1.5 in needle, anterolateral approach  Arthrogram: No  Medications: 3 mL lidocaine 1 %; 40 mg methylPREDNISolone acetate 40 MG/ML Outcome: tolerated well, no immediate complications Procedure, treatment alternatives, risks and benefits explained, specific risks discussed. Consent was given by the patient. Immediately prior to procedure a time out was called to verify the correct patient, procedure, equipment, support staff and site/side marked as required. Patient was prepped and draped in the usual sterile fashion.       Clinical Data: No additional findings.   Subjective: Chief Complaint  Patient presents with   Left Knee - Pain    HPI Patient's 67 year old female who comes in today with left knee pain.  She seen Dr. Magnus Ivan in the past for right knee pain she has known right knee arthritis is being given a cortisone injection in the knee that helped.  She denies any injury to the left knee.  Pain is mostly in the back of the knee.  She feels it may be due to the fact that she is overcompensated for the right knee.  She notes some swelling about the knee.   She has tried ice heat Tylenol and ibuprofen.  Denies any mechanical symptoms left knee.  She is diabetic reports her hemoglobin A1c to be 6.1.  Review of Systems   Objective: Vital Signs: There were no vitals taken for this visit.  Physical Exam Constitutional:      Appearance: She is not ill-appearing or diaphoretic.  Neurological:     Mental Status: She is alert and oriented to person, place, and time.  Psychiatric:        Mood and Affect: Mood normal.     Ortho Exam Bilateral knees: Full range of motion.  No instability valgus varus stressing of either knee.  No abnormal warmth erythema or effusion of either knee.  Left knee tenderness medial joint line and popliteal region.  Calves are supple and nontender bilaterally. Specialty Comments:  No specialty comments available.  Imaging: XR Knee 1-2 Views Left  Result Date: 10/02/2022 Left knee 2 views: No acute fracture.  Knee is well located.  Severe narrowing of the medial compartment moderate patellofemoral changes.  Lateral compartment is well-preserved.  No bony abnormalities otherwise.    PMFS History: Patient Active Problem List   Diagnosis Date Noted   Hx of adenomatous colonic polyps 12/15/2018   Obesity 04/28/2014   Essential hypertension, benign 04/28/2014   Hypercholesterolemia 04/28/2014  Leukopenia 01/31/2011   Hypertensive disorder 02/26/1998   Past Medical History:  Diagnosis Date   Anemia    Closed fracture of right proximal radius 01/20/2016   Fundic gland polyps of stomach, benign    GERD with stricture    Hx of adenomatous colonic polyps 12/15/2018   Hyperlipidemia    Hypertension    Leukopenia 01/31/2011   Mastodynia 03/20/2011   PONV (postoperative nausea and vomiting)    Torus fracture of distal end of right radius     Family History  Problem Relation Age of Onset   Cancer Mother        breast   Breast cancer Mother 67   Heart disease Maternal Aunt    Heart disease Maternal Uncle     Heart disease Maternal Grandmother    Heart disease Maternal Grandfather    Colon cancer Neg Hx    Esophageal cancer Neg Hx     Past Surgical History:  Procedure Laterality Date   COLONOSCOPY  2010   ESOPHAGOGASTRODUODENOSCOPY  2010   ORIF ELBOW FRACTURE Right 01/20/2016   Procedure: OPEN REDUCTION INTERNAL FIXATION (ORIF) RIGHT ELBOW/OLECRANON FRACTURE;  Surgeon: Bradly Bienenstock, MD;  Location: [facility] OR;  Service: Orthopedics;  Laterality: Right;   RADIAL HEAD ARTHROPLASTY Right 01/20/2016   Procedure: RIGHT RADIAL HEAD ARTHROPLASTY;  Surgeon: Bradly Bienenstock, MD;  Location: [facility] OR;  Service: Orthopedics;  Laterality: Right;   TONSILLECTOMY     at age 89   TUBAL LIGATION  1993   Social History   Occupational History   Occupation: Audiological scientist: [employer]  Tobacco Use   Smoking status: Never   Smokeless tobacco: Never   Tobacco comments:    never used product  Vaping Use   Vaping status: Never Used  Substance and Sexual Activity   Alcohol use: No    Alcohol/week: 0.0 standard drinks of alcohol   Drug use: No   Sexual activity: Yes    Birth control/protection: Surgical

## 2022-10-18 DIAGNOSIS — M8580 Other specified disorders of bone density and structure, unspecified site: Secondary | ICD-10-CM | POA: Diagnosis not present

## 2022-10-18 DIAGNOSIS — I10 Essential (primary) hypertension: Secondary | ICD-10-CM | POA: Diagnosis not present

## 2022-10-18 DIAGNOSIS — Z85820 Personal history of malignant melanoma of skin: Secondary | ICD-10-CM | POA: Diagnosis not present

## 2022-10-18 DIAGNOSIS — E785 Hyperlipidemia, unspecified: Secondary | ICD-10-CM | POA: Diagnosis not present

## 2022-10-18 DIAGNOSIS — E119 Type 2 diabetes mellitus without complications: Secondary | ICD-10-CM | POA: Diagnosis not present

## 2022-10-18 DIAGNOSIS — M1990 Unspecified osteoarthritis, unspecified site: Secondary | ICD-10-CM | POA: Diagnosis not present

## 2022-10-18 DIAGNOSIS — E669 Obesity, unspecified: Secondary | ICD-10-CM | POA: Diagnosis not present

## 2022-10-18 DIAGNOSIS — N209 Urinary calculus, unspecified: Secondary | ICD-10-CM | POA: Diagnosis not present

## 2022-10-18 DIAGNOSIS — D72819 Decreased white blood cell count, unspecified: Secondary | ICD-10-CM | POA: Diagnosis not present

## 2022-10-18 DIAGNOSIS — R1310 Dysphagia, unspecified: Secondary | ICD-10-CM | POA: Diagnosis not present

## 2022-10-18 DIAGNOSIS — E559 Vitamin D deficiency, unspecified: Secondary | ICD-10-CM | POA: Diagnosis not present

## 2022-10-19 ENCOUNTER — Other Ambulatory Visit (HOSPITAL_BASED_OUTPATIENT_CLINIC_OR_DEPARTMENT_OTHER): Payer: Self-pay

## 2022-10-19 ENCOUNTER — Ambulatory Visit (INDEPENDENT_AMBULATORY_CARE_PROVIDER_SITE_OTHER): Payer: PPO | Admitting: Student

## 2022-10-19 DIAGNOSIS — M25562 Pain in left knee: Secondary | ICD-10-CM

## 2022-10-19 MED ORDER — MELOXICAM 15 MG PO TABS
15.0000 mg | ORAL_TABLET | Freq: Every day | ORAL | 0 refills | Status: AC
  Filled 2022-10-19: qty 10, 10d supply, fill #0

## 2022-10-19 NOTE — Progress Notes (Signed)
Chief Complaint: Left knee pain     History of Present Illness:    Jasmine Castro is a 67 y.o. female presenting today with left knee pain.  She does have known bilateral knee osteoarthritis and had a cortisone injection performed on 10/02/2022.  This has been effective for symptom relief however she was in [place] last week and repeatedly had to take a large step up into the [place] she was traveling in.  Stated that this seemed to irritate her knee, however it is a different pain than she was experiencing before.  Denies any tenderness to touch, however she states that is mild to moderately painful with weightbearing.  Pain is located slightly below the kneecap, mainly on the medial side.  She has been taking NSAIDs as well as icing.   Surgical History:   None  PMH/PSH/Family History/Social History/Meds/Allergies:    Past Medical History:  Diagnosis Date   Anemia    Closed fracture of right proximal radius 01/20/2016   Fundic gland polyps of stomach, benign    GERD with stricture    Hx of adenomatous colonic polyps 12/15/2018   Hyperlipidemia    Hypertension    Leukopenia 01/31/2011   Mastodynia 03/20/2011   PONV (postoperative nausea and vomiting)    Torus fracture of distal end of right radius    Past Surgical History:  Procedure Laterality Date   COLONOSCOPY  2010   ESOPHAGOGASTRODUODENOSCOPY  2010   ORIF ELBOW FRACTURE Right 01/20/2016   Procedure: OPEN REDUCTION INTERNAL FIXATION (ORIF) RIGHT ELBOW/OLECRANON FRACTURE;  Surgeon: Bradly Bienenstock, MD;  Location: [facility];  Service: Orthopedics;  Laterality: Right;   RADIAL HEAD ARTHROPLASTY Right 01/20/2016   Procedure: RIGHT RADIAL HEAD ARTHROPLASTY;  Surgeon: Bradly Bienenstock, MD;  Location: [facility];  Service: Orthopedics;  Laterality: Right;   TONSILLECTOMY     at age 79   TUBAL LIGATION  81   Social History   Socioeconomic History   Marital status: Married    Spouse name: Not on file   Number of  children: Not on file   Years of education: Not on file   Highest education level: Not on file  Occupational History   Occupation: ADMIN ASSISITANT    Employer: [employer]  Tobacco Use   Smoking status: Never   Smokeless tobacco: Never   Tobacco comments:    never used product  Vaping Use   Vaping status: Never Used  Substance and Sexual Activity   Alcohol use: No    Alcohol/week: 0.0 standard drinks of alcohol   Drug use: No   Sexual activity: Yes    Birth control/protection: Surgical  Other Topics Concern   Not on file  Social History Narrative   Married, Psychologist, forensic at [employer]   No alcohol tobacco or drug use   Has at least 1 child as a son has had a liver transplant in 2019   Social Determinants of Health   Financial Resource Strain: Not on file  Food Insecurity: Not on file  Transportation Needs: Not on file  Physical Activity: Not on file  Stress: Not on file  Social Connections: Not on file   Family History  Problem Relation Age of Onset   Cancer Mother        breast   Breast cancer Mother  64   Heart disease Maternal Aunt    Heart disease Maternal Uncle    Heart disease Maternal Grandmother    Heart disease Maternal Grandfather    Colon cancer Neg Hx    Esophageal cancer Neg Hx    Allergies  Allergen Reactions   Sulfa Antibiotics Anaphylaxis and Swelling    Swelling of face,lips, mouth   Sulfonamide Derivatives Anaphylaxis and Swelling    Swelling of face, lips, and mouth   Current Outpatient Medications  Medication Sig Dispense Refill   meloxicam (MOBIC) 15 MG tablet Take 1 tablet (15 mg total) by mouth daily for 10 days. 10 tablet 0   amLODipine (NORVASC) 2.5 MG tablet Take 1 tablet (2.5 mg total) by mouth daily. 90 tablet 3   cholecalciferol (VITAMIN D) 1000 UNITS tablet Take 1,000 Units by mouth daily.     doxycycline (VIBRAMYCIN) 100 MG capsule Take 1 capsule (100 mg total) by mouth 2 (two) times daily with food 20 capsule 0    empagliflozin (JARDIANCE) 10 MG TABS tablet Take 1 tablet (10 mg total) by mouth daily. (Patient not taking: Reported on 09/12/2022) 90 tablet 2   empagliflozin (JARDIANCE) 10 MG TABS tablet Take 1 tablet (10 mg total) by mouth daily. 90 tablet 3   ibuprofen (ADVIL,MOTRIN) 200 MG tablet Take 200 mg by mouth daily as needed for headache or moderate pain.     metFORMIN (GLUCOPHAGE) 500 MG tablet Take 1 tablet (500 mg total) by mouth 2 (two) times daily. (Patient not taking: Reported on 09/12/2022) 60 tablet 6   metFORMIN (GLUCOPHAGE) 500 MG tablet Take 1 tablet (500 mg total) by mouth 2 (two) times daily as directed (Patient taking differently: Take 500 mg by mouth 2 (two) times daily. 1000 mg in AM and 500 mg in PM) 180 tablet 3   metroNIDAZOLE (METROCREAM) 0.75 % cream Apply 1 Application topically 2 (two) times daily to the face (Patient not taking: Reported on 09/12/2022) 45 g 2   metroNIDAZOLE (METROCREAM) 0.75 % cream Apply 1 Application topically 2 (two) times daily to face 45 g 2   Multiple Vitamin (MULTIVITAMIN) capsule Take 1 capsule by mouth daily.     mupirocin ointment (BACTROBAN) 2 % Apply 1 Application topically 2 (two) times daily to wound 22 g 1   nystatin ointment (MYCOSTATIN) APPLY TO THE AFFECTED AREAS TWICE DAILY 30 g 0   nystatin-triamcinolone ointment (MYCOLOG) Apply topically to affected area 2 (two) times daily. 30 g 1   pantoprazole (PROTONIX) 20 MG tablet Take 1 tablet (20 mg total) by mouth daily. 90 tablet 3   rosuvastatin (CRESTOR) 5 MG tablet Take 1 tablet (5 mg total) by mouth daily. (Patient taking differently: Take 5 mg by mouth 2 (two) times daily.) 90 tablet 3   telmisartan (MICARDIS) 80 MG tablet Take 1 tablet (80 mg total) by mouth daily. (Patient not taking: Reported on 09/12/2022) 90 tablet 2   telmisartan (MICARDIS) 80 MG tablet Take 1 tablet (80 mg total) by mouth daily. (Patient not taking: Reported on 09/12/2022) 90 tablet 3   telmisartan (MICARDIS) 80 MG tablet  Take 1 tablet (80 mg total) by mouth daily as directed 90 tablet 3   triamcinolone ointment (KENALOG) 0.1 % APPLY A THIN LAYER TO THE AFFECTED AREAS TWICE DAILY 30 g 0   vitamin C (ASCORBIC ACID) 500 MG tablet Take 1 tablet (500 mg total) by mouth daily. 30 tablet 0   vitamin E 400 UNIT capsule Take 400 Units by mouth  daily.     No current facility-administered medications for this visit.   No results found.  Review of Systems:   A ROS was performed including pertinent positives and negatives as documented in the HPI.  Physical Exam :   Constitutional: NAD and appears stated age Neurological: Alert and oriented Psych: Appropriate affect and cooperative There were no vitals taken for this visit.   Comprehensive Musculoskeletal Exam:    No medial or lateral joint line tenderness.  Active range of motion from 0-120 degrees with crepitus.  Small Baker's cyst present in the posterior knee.  Tenderness to palpation directly over the pes anserine insertion with mild swelling.  No laxity with varus or valgus stress.  Negative Lachman.  Imaging:     Assessment:   67 y.o. female with acute left knee pain.  The cortisone injection in her left knee given a few weeks ago has given her good relief, however her current pain does seem unrelated.  Based on the location of pain in the medial knee, I do suspect likely pes anserine bursitis.  After discussion of treatment options including anti-inflammatories versus targeted cortisone injection, patient would like to proceed with short course of meloxicam before considering injection.  Also recommend icing and elevation when possible.  Will plan to see her back as needed.  Plan :    - Start Meloxicam 15 mg for 10 days - Return to clinic as needed     I personally saw and evaluated the patient, and participated in the management and treatment plan.  Hazle Nordmann, PA-C Orthopedics  This document was dictated using Manufacturing engineer. A reasonable attempt at proof reading has been made to minimize errors.

## 2022-10-23 ENCOUNTER — Encounter (HOSPITAL_BASED_OUTPATIENT_CLINIC_OR_DEPARTMENT_OTHER): Payer: Self-pay

## 2022-10-24 DIAGNOSIS — E119 Type 2 diabetes mellitus without complications: Secondary | ICD-10-CM | POA: Diagnosis not present

## 2022-12-08 DIAGNOSIS — Z23 Encounter for immunization: Secondary | ICD-10-CM | POA: Diagnosis not present

## 2023-01-21 DIAGNOSIS — H40013 Open angle with borderline findings, low risk, bilateral: Secondary | ICD-10-CM | POA: Diagnosis not present

## 2023-02-01 DIAGNOSIS — K635 Polyp of colon: Secondary | ICD-10-CM | POA: Diagnosis not present

## 2023-02-01 DIAGNOSIS — E785 Hyperlipidemia, unspecified: Secondary | ICD-10-CM | POA: Diagnosis not present

## 2023-02-01 DIAGNOSIS — E559 Vitamin D deficiency, unspecified: Secondary | ICD-10-CM | POA: Diagnosis not present

## 2023-02-01 DIAGNOSIS — E669 Obesity, unspecified: Secondary | ICD-10-CM | POA: Diagnosis not present

## 2023-02-01 DIAGNOSIS — M1990 Unspecified osteoarthritis, unspecified site: Secondary | ICD-10-CM | POA: Diagnosis not present

## 2023-02-01 DIAGNOSIS — R002 Palpitations: Secondary | ICD-10-CM | POA: Diagnosis not present

## 2023-02-01 DIAGNOSIS — M8580 Other specified disorders of bone density and structure, unspecified site: Secondary | ICD-10-CM | POA: Diagnosis not present

## 2023-02-01 DIAGNOSIS — I10 Essential (primary) hypertension: Secondary | ICD-10-CM | POA: Diagnosis not present

## 2023-02-01 DIAGNOSIS — N209 Urinary calculus, unspecified: Secondary | ICD-10-CM | POA: Diagnosis not present

## 2023-02-01 DIAGNOSIS — E119 Type 2 diabetes mellitus without complications: Secondary | ICD-10-CM | POA: Diagnosis not present

## 2023-02-01 DIAGNOSIS — I872 Venous insufficiency (chronic) (peripheral): Secondary | ICD-10-CM | POA: Diagnosis not present

## 2023-02-01 DIAGNOSIS — D72819 Decreased white blood cell count, unspecified: Secondary | ICD-10-CM | POA: Diagnosis not present

## 2023-02-07 ENCOUNTER — Other Ambulatory Visit (HOSPITAL_COMMUNITY): Payer: Self-pay

## 2023-03-11 ENCOUNTER — Other Ambulatory Visit (HOSPITAL_COMMUNITY): Payer: Self-pay

## 2023-03-11 MED ORDER — ROSUVASTATIN CALCIUM 20 MG PO TABS
20.0000 mg | ORAL_TABLET | Freq: Every day | ORAL | 3 refills | Status: DC
  Filled 2023-03-11: qty 7, 7d supply, fill #0

## 2023-03-15 ENCOUNTER — Other Ambulatory Visit (HOSPITAL_COMMUNITY): Payer: Self-pay

## 2023-03-15 MED ORDER — ROSUVASTATIN CALCIUM 20 MG PO TABS
20.0000 mg | ORAL_TABLET | Freq: Every day | ORAL | 0 refills | Status: AC
Start: 2023-03-15 — End: ?
  Filled 2023-03-15: qty 30, 30d supply, fill #0

## 2023-03-18 ENCOUNTER — Other Ambulatory Visit (HOSPITAL_COMMUNITY): Payer: Self-pay

## 2023-03-20 ENCOUNTER — Other Ambulatory Visit: Payer: Self-pay | Admitting: Obstetrics and Gynecology

## 2023-03-20 DIAGNOSIS — Z1231 Encounter for screening mammogram for malignant neoplasm of breast: Secondary | ICD-10-CM

## 2023-04-30 DIAGNOSIS — L538 Other specified erythematous conditions: Secondary | ICD-10-CM | POA: Diagnosis not present

## 2023-04-30 DIAGNOSIS — L820 Inflamed seborrheic keratosis: Secondary | ICD-10-CM | POA: Diagnosis not present

## 2023-04-30 DIAGNOSIS — L570 Actinic keratosis: Secondary | ICD-10-CM | POA: Diagnosis not present

## 2023-04-30 DIAGNOSIS — R238 Other skin changes: Secondary | ICD-10-CM | POA: Diagnosis not present

## 2023-04-30 DIAGNOSIS — B078 Other viral warts: Secondary | ICD-10-CM | POA: Diagnosis not present

## 2023-06-11 DIAGNOSIS — H40013 Open angle with borderline findings, low risk, bilateral: Secondary | ICD-10-CM | POA: Diagnosis not present

## 2023-06-13 DIAGNOSIS — B078 Other viral warts: Secondary | ICD-10-CM | POA: Diagnosis not present

## 2023-06-13 DIAGNOSIS — L538 Other specified erythematous conditions: Secondary | ICD-10-CM | POA: Diagnosis not present

## 2023-06-13 DIAGNOSIS — R208 Other disturbances of skin sensation: Secondary | ICD-10-CM | POA: Diagnosis not present

## 2023-06-25 DIAGNOSIS — E119 Type 2 diabetes mellitus without complications: Secondary | ICD-10-CM | POA: Diagnosis not present

## 2023-06-25 DIAGNOSIS — Z1212 Encounter for screening for malignant neoplasm of rectum: Secondary | ICD-10-CM | POA: Diagnosis not present

## 2023-06-25 DIAGNOSIS — E785 Hyperlipidemia, unspecified: Secondary | ICD-10-CM | POA: Diagnosis not present

## 2023-06-25 DIAGNOSIS — I10 Essential (primary) hypertension: Secondary | ICD-10-CM | POA: Diagnosis not present

## 2023-06-25 DIAGNOSIS — E559 Vitamin D deficiency, unspecified: Secondary | ICD-10-CM | POA: Diagnosis not present

## 2023-07-02 DIAGNOSIS — E785 Hyperlipidemia, unspecified: Secondary | ICD-10-CM | POA: Diagnosis not present

## 2023-07-02 DIAGNOSIS — E559 Vitamin D deficiency, unspecified: Secondary | ICD-10-CM | POA: Diagnosis not present

## 2023-07-02 DIAGNOSIS — R82998 Other abnormal findings in urine: Secondary | ICD-10-CM | POA: Diagnosis not present

## 2023-07-02 DIAGNOSIS — E669 Obesity, unspecified: Secondary | ICD-10-CM | POA: Diagnosis not present

## 2023-07-02 DIAGNOSIS — E119 Type 2 diabetes mellitus without complications: Secondary | ICD-10-CM | POA: Diagnosis not present

## 2023-07-02 DIAGNOSIS — M8580 Other specified disorders of bone density and structure, unspecified site: Secondary | ICD-10-CM | POA: Diagnosis not present

## 2023-07-02 DIAGNOSIS — D72819 Decreased white blood cell count, unspecified: Secondary | ICD-10-CM | POA: Diagnosis not present

## 2023-07-02 DIAGNOSIS — Z0000 Encounter for general adult medical examination without abnormal findings: Secondary | ICD-10-CM | POA: Diagnosis not present

## 2023-07-02 DIAGNOSIS — Z23 Encounter for immunization: Secondary | ICD-10-CM | POA: Diagnosis not present

## 2023-07-02 DIAGNOSIS — Z85820 Personal history of malignant melanoma of skin: Secondary | ICD-10-CM | POA: Diagnosis not present

## 2023-07-02 DIAGNOSIS — I10 Essential (primary) hypertension: Secondary | ICD-10-CM | POA: Diagnosis not present

## 2023-07-29 ENCOUNTER — Other Ambulatory Visit (HOSPITAL_COMMUNITY): Payer: Self-pay

## 2023-07-29 ENCOUNTER — Encounter (HOSPITAL_COMMUNITY): Payer: Self-pay

## 2023-08-13 ENCOUNTER — Other Ambulatory Visit: Payer: Self-pay

## 2023-08-13 ENCOUNTER — Other Ambulatory Visit (HOSPITAL_COMMUNITY): Payer: Self-pay

## 2023-08-13 DIAGNOSIS — N9089 Other specified noninflammatory disorders of vulva and perineum: Secondary | ICD-10-CM | POA: Diagnosis not present

## 2023-08-13 MED ORDER — NYSTATIN 100000 UNIT/GM EX OINT
1.0000 | TOPICAL_OINTMENT | Freq: Two times a day (BID) | CUTANEOUS | 3 refills | Status: DC
  Filled 2023-08-13: qty 30, 30d supply, fill #0

## 2023-08-13 MED ORDER — TRIAMCINOLONE ACETONIDE 0.1 % EX OINT
1.0000 | TOPICAL_OINTMENT | Freq: Two times a day (BID) | CUTANEOUS | 3 refills | Status: AC
  Filled 2023-08-13: qty 30, 15d supply, fill #0

## 2023-08-16 DIAGNOSIS — L718 Other rosacea: Secondary | ICD-10-CM | POA: Diagnosis not present

## 2023-08-16 DIAGNOSIS — L111 Transient acantholytic dermatosis [Grover]: Secondary | ICD-10-CM | POA: Diagnosis not present

## 2023-08-16 DIAGNOSIS — L304 Erythema intertrigo: Secondary | ICD-10-CM | POA: Diagnosis not present

## 2023-08-16 DIAGNOSIS — L218 Other seborrheic dermatitis: Secondary | ICD-10-CM | POA: Diagnosis not present

## 2023-08-16 DIAGNOSIS — L821 Other seborrheic keratosis: Secondary | ICD-10-CM | POA: Diagnosis not present

## 2023-08-16 DIAGNOSIS — D225 Melanocytic nevi of trunk: Secondary | ICD-10-CM | POA: Diagnosis not present

## 2023-08-16 DIAGNOSIS — L814 Other melanin hyperpigmentation: Secondary | ICD-10-CM | POA: Diagnosis not present

## 2023-08-19 ENCOUNTER — Ambulatory Visit
Admission: RE | Admit: 2023-08-19 | Discharge: 2023-08-19 | Disposition: A | Discharge status: Home / Self Care | Payer: PPO | Source: Ambulatory Visit | Attending: Obstetrics and Gynecology | Admitting: Obstetrics and Gynecology

## 2023-08-19 ENCOUNTER — Other Ambulatory Visit: Payer: Self-pay | Admitting: Obstetrics and Gynecology

## 2023-08-19 ENCOUNTER — Encounter: Payer: Self-pay | Admitting: Obstetrics and Gynecology

## 2023-08-19 DIAGNOSIS — Z1231 Encounter for screening mammogram for malignant neoplasm of breast: Secondary | ICD-10-CM | POA: Diagnosis not present

## 2023-09-12 ENCOUNTER — Inpatient Hospital Stay: Payer: PPO | Admitting: Hematology & Oncology

## 2023-09-12 ENCOUNTER — Inpatient Hospital Stay: Payer: PPO | Attending: Hematology & Oncology

## 2023-09-12 ENCOUNTER — Encounter: Payer: Self-pay | Admitting: Hematology & Oncology

## 2023-09-12 ENCOUNTER — Other Ambulatory Visit: Payer: Self-pay

## 2023-09-12 VITALS — BP 144/71 | HR 65 | Temp 98.2°F | Resp 18 | Ht 64.0 in | Wt 182.0 lb

## 2023-09-12 DIAGNOSIS — D709 Neutropenia, unspecified: Secondary | ICD-10-CM | POA: Diagnosis not present

## 2023-09-12 DIAGNOSIS — Z8739 Personal history of other diseases of the musculoskeletal system and connective tissue: Secondary | ICD-10-CM | POA: Insufficient documentation

## 2023-09-12 DIAGNOSIS — K429 Umbilical hernia without obstruction or gangrene: Secondary | ICD-10-CM | POA: Insufficient documentation

## 2023-09-12 DIAGNOSIS — D708 Other neutropenia: Secondary | ICD-10-CM | POA: Insufficient documentation

## 2023-09-12 DIAGNOSIS — M79604 Pain in right leg: Secondary | ICD-10-CM | POA: Insufficient documentation

## 2023-09-12 LAB — CBC WITH DIFFERENTIAL (CANCER CENTER ONLY)
Abs Immature Granulocytes: 0 K/uL (ref 0.00–0.07)
Basophils Absolute: 0 K/uL (ref 0.0–0.1)
Basophils Relative: 0 %
Eosinophils Absolute: 0.1 K/uL (ref 0.0–0.5)
Eosinophils Relative: 4 %
HCT: 39.2 % (ref 36.0–46.0)
Hemoglobin: 13.1 g/dL (ref 12.0–15.0)
Immature Granulocytes: 0 %
Lymphocytes Relative: 44 %
Lymphs Abs: 1.3 K/uL (ref 0.7–4.0)
MCH: 29.9 pg (ref 26.0–34.0)
MCHC: 33.4 g/dL (ref 30.0–36.0)
MCV: 89.5 fL (ref 80.0–100.0)
Monocytes Absolute: 0.8 K/uL (ref 0.1–1.0)
Monocytes Relative: 27 %
Neutro Abs: 0.7 K/uL — ABNORMAL LOW (ref 1.7–7.7)
Neutrophils Relative %: 25 %
Platelet Count: 281 K/uL (ref 150–400)
RBC: 4.38 MIL/uL (ref 3.87–5.11)
RDW: 12.6 % (ref 11.5–15.5)
WBC Count: 2.8 K/uL — ABNORMAL LOW (ref 4.0–10.5)
nRBC: 0 % (ref 0.0–0.2)

## 2023-09-12 LAB — SAVE SMEAR(SSMR), FOR PROVIDER SLIDE REVIEW

## 2023-09-12 LAB — CMP (CANCER CENTER ONLY)
ALT: 14 U/L (ref 0–44)
AST: 14 U/L — ABNORMAL LOW (ref 15–41)
Albumin: 4.7 g/dL (ref 3.5–5.0)
Alkaline Phosphatase: 53 U/L (ref 38–126)
Anion gap: 8 (ref 5–15)
BUN: 15 mg/dL (ref 8–23)
CO2: 30 mmol/L (ref 22–32)
Calcium: 9.5 mg/dL (ref 8.9–10.3)
Chloride: 102 mmol/L (ref 98–111)
Creatinine: 0.71 mg/dL (ref 0.44–1.00)
GFR, Estimated: >60 mL/min (ref >60)
Glucose, Bld: 145 mg/dL — ABNORMAL HIGH (ref 70–99)
Potassium: 4.1 mmol/L (ref 3.5–5.1)
Sodium: 140 mmol/L (ref 135–145)
Total Bilirubin: 0.5 mg/dL (ref 0.0–1.2)
Total Protein: 6.8 g/dL (ref 6.5–8.1)

## 2023-09-12 LAB — LACTATE DEHYDROGENASE: LDH: 71 U/L — ABNORMAL LOW (ref 98–192)

## 2023-09-12 NOTE — Progress Notes (Signed)
 Hematology and Oncology Follow Up Visit  ALLISON DESHOTELS [id number] 1955/12/21 68 y.o. 09/12/2023   Principle Diagnosis:  Autoimmune neutropenia  Current Therapy:   Observation     Interim History:  Ms.  Bomkamp is back for followup.  We last saw her a year ago.  She is still taking care of her mom.  I think her mom is 40 years old.  She seems to be doing pretty well right now.  Her son moved up to [place], [place] .  He seems to be doing quite well up there with his new wife.  She has had no problem with infections.  She has had no problem with fever.  She has had no problems with bleeding.  There is been no change in bowel or bladder habits.  She has had no rashes.  There is been no leg swelling.  She has had no mouth sores.  She had a mammogram diagnostics 23 2025.  The mammogram was unremarkable.  Overall, I would have said that her performance status is probably ECOG 0.   Medications:  Current Outpatient Medications:    amLODipine  (NORVASC ) 2.5 MG tablet, Take 1 tablet (2.5 mg total) by mouth daily., Disp: 90 tablet, Rfl: 3   cholecalciferol  (VITAMIN D ) 1000 UNITS tablet, Take 1,000 Units by mouth daily., Disp: , Rfl:    doxycycline  (VIBRAMYCIN ) 100 MG capsule, Take 1 capsule (100 mg total) by mouth 2 (two) times daily with food, Disp: 20 capsule, Rfl: 0   empagliflozin  (JARDIANCE ) 10 MG TABS tablet, Take 1 tablet (10 mg total) by mouth daily. (Patient not taking: Reported on 09/12/2022), Disp: 90 tablet, Rfl: 2   empagliflozin  (JARDIANCE ) 10 MG TABS tablet, Take 1 tablet (10 mg total) by mouth daily., Disp: 90 tablet, Rfl: 3   ibuprofen  (ADVIL ,MOTRIN ) 200 MG tablet, Take 200 mg by mouth daily as needed for headache or moderate pain., Disp: , Rfl:    metFORMIN  (GLUCOPHAGE ) 500 MG tablet, Take 1 tablet (500 mg total) by mouth 2 (two) times daily. (Patient not taking: Reported on 09/12/2022), Disp: 60 tablet, Rfl: 6   metFORMIN  (GLUCOPHAGE ) 500 MG tablet, Take 1 tablet (500 mg total) by  mouth 2 (two) times daily as directed (Patient taking differently: Take 500 mg by mouth 2 (two) times daily. 1000 mg in AM and 500 mg in PM), Disp: 180 tablet, Rfl: 3   metroNIDAZOLE  (METROCREAM ) 0.75 % cream, Apply 1 Application topically 2 (two) times daily to the face (Patient not taking: Reported on 09/12/2022), Disp: 45 g, Rfl: 2   metroNIDAZOLE  (METROCREAM ) 0.75 % cream, Apply 1 Application topically 2 (two) times daily to face, Disp: 45 g, Rfl: 2   Multiple Vitamin (MULTIVITAMIN) capsule, Take 1 capsule by mouth daily., Disp: , Rfl:    mupirocin  ointment (BACTROBAN ) 2 %, Apply 1 Application topically 2 (two) times daily to wound, Disp: 22 g, Rfl: 1   nystatin  ointment (MYCOSTATIN ), APPLY TO THE AFFECTED AREAS TWICE DAILY, Disp: 30 g, Rfl: 0   nystatin  ointment (MYCOSTATIN ), Apply 1 Application topically 2 (two) times daily., Disp: 30 g, Rfl: 3   nystatin -triamcinolone  ointment (MYCOLOG), Apply topically to affected area 2 (two) times daily., Disp: 30 g, Rfl: 1   pantoprazole  (PROTONIX ) 20 MG tablet, Take 1 tablet (20 mg total) by mouth daily., Disp: 90 tablet, Rfl: 3   rosuvastatin  (CRESTOR ) 20 MG tablet, Take 1 tablet (20 mg total) by mouth daily., Disp: 7 tablet, Rfl: 3   rosuvastatin  (CRESTOR ) 20 MG tablet, Take  1 tablet (20 mg total) by mouth daily., Disp: 30 tablet, Rfl: 0   rosuvastatin  (CRESTOR ) 5 MG tablet, Take 1 tablet (5 mg total) by mouth daily. (Patient taking differently: Take 5 mg by mouth 2 (two) times daily.), Disp: 90 tablet, Rfl: 3   telmisartan  (MICARDIS ) 80 MG tablet, Take 1 tablet (80 mg total) by mouth daily. (Patient not taking: Reported on 09/12/2022), Disp: 90 tablet, Rfl: 2   telmisartan  (MICARDIS ) 80 MG tablet, Take 1 tablet (80 mg total) by mouth daily. (Patient not taking: Reported on 09/12/2022), Disp: 90 tablet, Rfl: 3   telmisartan  (MICARDIS ) 80 MG tablet, Take 1 tablet (80 mg total) by mouth daily as directed, Disp: 90 tablet, Rfl: 3   triamcinolone  ointment  (KENALOG ) 0.1 %, APPLY A THIN LAYER TO THE AFFECTED AREAS TWICE DAILY, Disp: 30 g, Rfl: 0   triamcinolone  ointment (KENALOG ) 0.1 %, Apply 1 thin application to the affected areas topically 2 (two) times daily., Disp: 30 g, Rfl: 3   vitamin C  (ASCORBIC ACID) 500 MG tablet, Take 1 tablet (500 mg total) by mouth daily., Disp: 30 tablet, Rfl: 0   vitamin E  400 UNIT capsule, Take 400 Units by mouth daily., Disp: , Rfl:   Allergies:  Allergies  Allergen Reactions   Sulfa Antibiotics Anaphylaxis and Swelling    Swelling of face,lips, mouth   Sulfonamide Derivatives Anaphylaxis and Swelling    Swelling of face, lips, and mouth    Past Medical History, Surgical history, Social history, and Family History were reviewed and updated.  Review of System:   Review of Systems  Constitutional: Negative.   HENT: Negative.    Eyes: Negative.   Respiratory: Negative.    Cardiovascular: Negative.   Gastrointestinal: Negative.   Genitourinary: Negative.   Musculoskeletal: Negative.   Skin: Negative.   Neurological: Negative.   Endo/Heme/Allergies: Negative.   Psychiatric/Behavioral: Negative.      Physical Exam: Physical Exam Vitals reviewed.  HENT:     Head: Normocephalic and atraumatic.  Eyes:     Pupils: Pupils are equal, round, and reactive to light.  Cardiovascular:     Rate and Rhythm: Normal rate and regular rhythm.     Heart sounds: Normal heart sounds.  Pulmonary:     Effort: Pulmonary effort is normal.     Breath sounds: Normal breath sounds.  Abdominal:     General: Bowel sounds are normal.     Palpations: Abdomen is soft.  Musculoskeletal:        General: No tenderness or deformity. Normal range of motion.     Cervical back: Normal range of motion.  Lymphadenopathy:     Cervical: No cervical adenopathy.  Skin:    General: Skin is warm and dry.     Findings: No erythema or rash.  Neurological:     Mental Status: She is alert and oriented to person, place, and time.   Psychiatric:        Behavior: Behavior normal.        Thought Content: Thought content normal.        Judgment: Judgment normal.      Lab Results  Component Value Date   WBC 2.9 (L) 09/12/2022   HGB 13.6 09/12/2022   HCT 40.9 09/12/2022   MCV 92.5 09/12/2022   PLT 287 09/12/2022     Chemistry      Component Value Date/Time   NA 141 09/12/2022 0823   K 4.3 09/12/2022 0823   CL 101 09/12/2022 9176  CO2 29 09/12/2022 0823   BUN 20 09/12/2022 0823   CREATININE 0.82 09/12/2022 0823      Component Value Date/Time   CALCIUM  9.8 09/12/2022 0823   ALKPHOS 56 09/12/2022 0823   AST 18 09/12/2022 0823   ALT 28 09/12/2022 0823   BILITOT 0.6 09/12/2022 0823      Impression and Plan: Ms. Ferrucci is 68 year old white female. She has chronic neutropenia. We have been following her now for a little over 11 years.  So far, she has had absolutely no problems with infections.  She has had no evidence of any progression to any type of myelodysplastic process.  I looked at her blood smear under the microscope.  Everything looked fine.  I did not see any immature myeloid cells.  She has good maturation of her white blood cells.  There is no nucleated red blood cells.  Platelets are adequate in number and size.  We will plan for another follow-up in 1 year.  Again, I would be surprised if she had any issues with the leukopenia.  Maude JONELLE Crease, MD 7/17/20258:27 AM

## 2023-12-14 DIAGNOSIS — Z23 Encounter for immunization: Secondary | ICD-10-CM | POA: Diagnosis not present

## 2023-12-21 ENCOUNTER — Other Ambulatory Visit (HOSPITAL_BASED_OUTPATIENT_CLINIC_OR_DEPARTMENT_OTHER): Payer: Self-pay

## 2023-12-21 ENCOUNTER — Emergency Department (HOSPITAL_BASED_OUTPATIENT_CLINIC_OR_DEPARTMENT_OTHER)
Admission: EM | Admit: 2023-12-21 | Discharge: 2023-12-21 | Disposition: A | Discharge status: Home / Self Care | Attending: Emergency Medicine | Admitting: Emergency Medicine

## 2023-12-21 ENCOUNTER — Encounter (HOSPITAL_BASED_OUTPATIENT_CLINIC_OR_DEPARTMENT_OTHER): Payer: Self-pay

## 2023-12-21 ENCOUNTER — Emergency Department (HOSPITAL_BASED_OUTPATIENT_CLINIC_OR_DEPARTMENT_OTHER)

## 2023-12-21 ENCOUNTER — Other Ambulatory Visit: Payer: Self-pay

## 2023-12-21 DIAGNOSIS — I4891 Unspecified atrial fibrillation: Secondary | ICD-10-CM | POA: Insufficient documentation

## 2023-12-21 DIAGNOSIS — R002 Palpitations: Secondary | ICD-10-CM | POA: Diagnosis not present

## 2023-12-21 LAB — CBC
HCT: 40.7 % (ref 36.0–46.0)
Hemoglobin: 13.7 g/dL (ref 12.0–15.0)
MCH: 30.2 pg (ref 26.0–34.0)
MCHC: 33.7 g/dL (ref 30.0–36.0)
MCV: 89.6 fL (ref 80.0–100.0)
Platelets: 282 K/uL (ref 150–400)
RBC: 4.54 MIL/uL (ref 3.87–5.11)
RDW: 12.8 % (ref 11.5–15.5)
WBC: 2.5 K/uL — ABNORMAL LOW (ref 4.0–10.5)
nRBC: 0 % (ref 0.0–0.2)

## 2023-12-21 LAB — COMPREHENSIVE METABOLIC PANEL WITH GFR
ALT: 15 U/L (ref 0–44)
AST: 13 U/L — ABNORMAL LOW (ref 15–41)
Albumin: 4.5 g/dL (ref 3.5–5.0)
Alkaline Phosphatase: 70 U/L (ref 38–126)
Anion gap: 12 (ref 5–15)
BUN: 13 mg/dL (ref 8–23)
CO2: 25 mmol/L (ref 22–32)
Calcium: 9.5 mg/dL (ref 8.9–10.3)
Chloride: 103 mmol/L (ref 98–111)
Creatinine, Ser: 0.66 mg/dL (ref 0.44–1.00)
GFR, Estimated: >60 mL/min (ref >60)
Glucose, Bld: 145 mg/dL — ABNORMAL HIGH (ref 70–99)
Potassium: 3.9 mmol/L (ref 3.5–5.1)
Sodium: 140 mmol/L (ref 135–145)
Total Bilirubin: 0.6 mg/dL (ref 0.0–1.2)
Total Protein: 6.7 g/dL (ref 6.5–8.1)

## 2023-12-21 LAB — TSH: TSH: 1.34 u[IU]/mL (ref 0.350–4.500)

## 2023-12-21 LAB — T4, FREE: Free T4: 0.73 ng/dL (ref 0.61–1.12)

## 2023-12-21 MED ORDER — PROPOFOL 10 MG/ML IV BOLUS
0.5000 mg/kg | Freq: Once | INTRAVENOUS | Status: DC
  Filled 2023-12-21: qty 20

## 2023-12-21 MED ORDER — DILTIAZEM HCL-DEXTROSE 125-5 MG/125ML-% IV SOLN (PREMIX)
5.0000 mg/h | INTRAVENOUS | Status: DC
  Administered 2023-12-21: 5 mg/h via INTRAVENOUS
  Filled 2023-12-21: qty 125

## 2023-12-21 MED ORDER — DILTIAZEM HCL ER COATED BEADS 120 MG PO CP24
120.0000 mg | ORAL_CAPSULE | Freq: Every day | ORAL | 0 refills | Status: DC
  Filled 2023-12-21: qty 30, 30d supply, fill #0

## 2023-12-21 MED ORDER — APIXABAN 5 MG PO TABS
5.0000 mg | ORAL_TABLET | Freq: Two times a day (BID) | ORAL | 0 refills | Status: DC
Start: 2023-12-21 — End: 2024-01-15
  Filled 2023-12-21: qty 60, 30d supply, fill #0

## 2023-12-21 MED ORDER — APIXABAN 2.5 MG PO TABS
5.0000 mg | ORAL_TABLET | Freq: Once | ORAL | Status: AC
Start: 2023-12-21 — End: 2023-12-21
  Administered 2023-12-21: 5 mg via ORAL
  Filled 2023-12-21: qty 2

## 2023-12-21 MED ORDER — DILTIAZEM HCL ER COATED BEADS 120 MG PO CP24
120.0000 mg | ORAL_CAPSULE | Freq: Once | ORAL | Status: AC
  Administered 2023-12-21: 120 mg via ORAL
  Filled 2023-12-21: qty 1

## 2023-12-21 MED ORDER — DILTIAZEM LOAD VIA INFUSION
20.0000 mg | Freq: Once | INTRAVENOUS | Status: AC
  Administered 2023-12-21: 20 mg via INTRAVENOUS
  Filled 2023-12-21: qty 20

## 2023-12-21 NOTE — ED Triage Notes (Signed)
 Heart fluttering onset 0700. Denies chest pain, SOB, nausea, or vomiting.

## 2023-12-21 NOTE — Discharge Instructions (Signed)
 It was our pleasure to provide your ER care today - we hope that you feel better.  We discussed your case with our cardiologist - they indicate for you to take eliquis and cardizem as prescribed, and follow up closely with  them in their atrial fibrillation clinic this coming week - they should call you with appointment time - if you have not heard from them by Tuesday, call office to arrange appointment.   Fall precautions.  Return to ER if worse, new symptoms, fevers, new/severe pain, chest pain, trouble breathing, persistent fast heart beating, fainting, numbness/weakness, change in speech or vision, or other concern.

## 2023-12-21 NOTE — ED Notes (Addendum)
 Pt converted herself to NSR. HR 76. MD aware. Instructed to stop Cardizem drip and continue to monitor pt. Pt has no complaints at this time.

## 2023-12-21 NOTE — ED Provider Notes (Addendum)
 [place] EMERGENCY DEPARTMENT AT [facility] Provider Note   CSN: [id number] Arrival date & time: 12/21/23  9185     Patient presents with: Palpitations   Jasmine Castro is a 68 y.o. female.   Pt with c/o palpitations that she became aware of around 7 AM today. No hx same. No hx afib or other dysrhythmia. No hx heart disease. Father had afib. No recent chest pain or discomfort. No sob or unusual doe or fatigue. No syncope. Recent health at baseline. No recent febrile or viral illness. No recent sweats, wt loss  or hx thyroid  disease. No recent change in meds. No recent heavy etoh use.   The history is provided by the patient and medical records.  Palpitations Associated symptoms: no back pain, no chest pain, no cough, no nausea, no numbness, no shortness of breath, no vomiting and no weakness        Prior to Admission medications   Medication Sig Start Date End Date Taking? Authorizing Provider  amLODipine  (NORVASC ) 2.5 MG tablet Take 1 tablet by mouth daily.    [provider]  cholecalciferol  (VITAMIN D ) 1000 UNITS tablet Take 1,000 Units by mouth daily.    [provider]  empagliflozin  (JARDIANCE ) 10 MG TABS tablet Take 1 tablet (10 mg total) by mouth daily. Patient not taking: Reported on 09/12/2022 09/27/20     fluticasone (CUTIVATE) 0.05 % cream Apply 1 Application topically 2 (two) times daily.    [provider]  ibuprofen  (ADVIL ,MOTRIN ) 200 MG tablet Take 200 mg by mouth daily as needed for headache or moderate pain.    [provider]  ketoconazole (NIZORAL) 2 % cream Apply 1 Application topically in the morning, at noon, and at bedtime.    [provider]  metFORMIN  (GLUCOPHAGE ) 500 MG tablet Take 1 tablet (500 mg total) by mouth 2 (two) times daily. Patient not taking: Reported on 09/12/2022 09/27/20     metroNIDAZOLE  (METROCREAM ) 0.75 % cream Apply 1 Application topically 2 (two) times daily to the face Patient not  taking: Reported on 09/12/2022 08/14/21     Multiple Vitamin (MULTIVITAMIN) capsule Take 1 capsule by mouth daily.    [provider]  mupirocin  ointment (BACTROBAN ) 2 % Apply 1 Application topically 2 (two) times daily to wound 08/28/22     nystatin  ointment (MYCOSTATIN ) APPLY TO THE AFFECTED AREAS TWICE DAILY 08/13/22     pantoprazole  (PROTONIX ) 20 MG tablet Take 1 tablet (20 mg total) by mouth daily. 11/14/21     rosuvastatin  (CRESTOR ) 20 MG tablet Take 1 tablet (20 mg total) by mouth daily. 03/15/23     rosuvastatin  (CRESTOR ) 5 MG tablet Take 1 tablet (5 mg total) by mouth daily. Patient taking differently: Take 5 mg by mouth 2 (two) times daily. 03/27/22     telmisartan  (MICARDIS ) 80 MG tablet Take 1 tablet (80 mg total) by mouth daily. Patient not taking: Reported on 09/12/2022 09/27/20     triamcinolone  ointment (KENALOG ) 0.1 % Apply 1 thin application to the affected areas topically 2 (two) times daily. 08/13/23     vitamin C  (ASCORBIC ACID) 500 MG tablet Take 1 tablet (500 mg total) by mouth daily. 01/20/16   Marland Lucie Boot, PA  vitamin E  400 UNIT capsule Take 400 Units by mouth daily.    [provider]    Allergies: Sulfa antibiotics and Sulfonamide derivatives    Review of Systems  Constitutional:  Negative for chills and fever.  HENT:  Negative for  sore throat.   Eyes:  Negative for visual disturbance.  Respiratory:  Negative for cough and shortness of breath.   Cardiovascular:  Positive for palpitations. Negative for chest pain and leg swelling.  Gastrointestinal:  Negative for abdominal pain, nausea and vomiting.  Genitourinary:  Negative for dysuria and flank pain.  Musculoskeletal:  Negative for back pain and neck pain.  Neurological:  Negative for syncope, speech difficulty, weakness, numbness and headaches.    Updated Vital Signs BP 120/79   Pulse (!) 138   Temp 98.6 F (37 C) (Oral)   Resp 14   Ht 1.626 m (5' 4)   Wt 81.6 kg   SpO2 93%   BMI  30.90 kg/m   Physical Exam Vitals and nursing note reviewed.  Constitutional:      Appearance: Normal appearance. She is well-developed.  HENT:     Head: Atraumatic.     Nose: Nose normal.     Mouth/Throat:     Mouth: Mucous membranes are moist.  Eyes:     General: No scleral icterus.    Conjunctiva/sclera: Conjunctivae normal.     Pupils: Pupils are equal, round, and reactive to light.  Neck:     Trachea: No tracheal deviation.     Comments: Trachea midline. Thyroid  not grossly enlarged or tender.  Cardiovascular:     Rate and Rhythm: Normal rate and regular rhythm.     Pulses: Normal pulses.     Heart sounds: Normal heart sounds. No murmur heard.    No friction rub. No gallop.  Pulmonary:     Effort: Pulmonary effort is normal. No respiratory distress.     Breath sounds: Normal breath sounds.  Abdominal:     General: Bowel sounds are normal. There is no distension.     Palpations: Abdomen is soft.     Tenderness: There is no abdominal tenderness.  Genitourinary:    Comments: No cva tenderness.  Musculoskeletal:        General: No swelling or tenderness.     Cervical back: Normal range of motion and neck supple. No rigidity. No muscular tenderness.     Right lower leg: No edema.     Left lower leg: No edema.  Skin:    General: Skin is warm and dry.     Findings: No rash.  Neurological:     Mental Status: She is alert.     Comments: Alert, speech normal. Motor/sens grossly intact bil. Steady gait.   Psychiatric:        Mood and Affect: Mood normal.     (all labs ordered are listed, but only abnormal results are displayed) Results for orders placed or performed during the hospital encounter of 12/21/23  CBC   Collection Time: 12/21/23  8:58 AM  Result Value Ref Range   WBC 2.5 (L) 4.0 - 10.5 K/uL   RBC 4.54 3.87 - 5.11 MIL/uL   Hemoglobin 13.7 12.0 - 15.0 g/dL   HCT 59.2 63.9 - 53.9 %   MCV 89.6 80.0 - 100.0 fL   MCH 30.2 26.0 - 34.0 pg   MCHC 33.7 30.0 -  36.0 g/dL   RDW 87.1 88.4 - 84.4 %   Platelets 282 150 - 400 K/uL   nRBC 0.0 0.0 - 0.2 %  Comprehensive metabolic panel with GFR   Collection Time: 12/21/23  8:58 AM  Result Value Ref Range   Sodium 140 135 - 145 mmol/L   Potassium 3.9 3.5 - 5.1 mmol/L  Chloride 103 98 - 111 mmol/L   CO2 25 22 - 32 mmol/L   Glucose, Bld 145 (H) 70 - 99 mg/dL   BUN 13 8 - 23 mg/dL   Creatinine, Ser 9.33 0.44 - 1.00 mg/dL   Calcium  9.5 8.9 - 10.3 mg/dL   Total Protein 6.7 6.5 - 8.1 g/dL   Albumin 4.5 3.5 - 5.0 g/dL   AST 13 (L) 15 - 41 U/L   ALT 15 0 - 44 U/L   Alkaline Phosphatase 70 38 - 126 U/L   Total Bilirubin 0.6 0.0 - 1.2 mg/dL   GFR, Estimated >39 >39 mL/min   Anion gap 12 5 - 15  TSH   Collection Time: 12/21/23  8:58 AM  Result Value Ref Range   TSH 1.340 0.350 - 4.500 uIU/mL     EKG: EKG Interpretation Date/Time:  Saturday December 21 2023 08:22:07 EDT Ventricular Rate:  130 PR Interval:    QRS Duration:  94 QT Interval:  319 QTC Calculation: 470 R Axis:   20  Text Interpretation: Atrial fibrillation Non-specific ST-t changes Confirmed by Bernard Drivers ([id number]) on 12/21/2023 8:24:52 AM  Radiology: [facility] Chest Port 1 View Result Date: 12/21/2023 EXAM: 1 VIEW(S) XRAY OF THE CHEST 12/21/2023 08:55:52 AM COMPARISON: 12/24/2020 CLINICAL HISTORY: palpitations. palpitations FINDINGS: LUNGS AND PLEURA: No focal pulmonary opacity. No pulmonary edema. No pleural effusion. No pneumothorax. HEART AND MEDIASTINUM: No acute abnormality of the cardiac and mediastinal silhouettes. BONES AND SOFT TISSUES: No acute osseous abnormality. IMPRESSION: 1. No acute cardiopulmonary process identified. Electronically signed by: Waddell Calk MD 12/21/2023 09:02 AM EDT RP Workstation: [id number]     Procedures   Medications Ordered in the ED  diltiazem (CARDIZEM) 1 mg/mL load via infusion 20 mg (20 mg Intravenous Bolus from Bag 12/21/23 0859)    And  diltiazem (CARDIZEM) 125 mg in dextrose  5% 125 mL (1  mg/mL) infusion (12.5 mg/hr Intravenous Rate/Dose Change 12/21/23 1039)  propofol  (DIPRIVAN ) 10 mg/mL bolus/IV push 40.8 mg (has no administration in time range)                                    Medical Decision Making Problems Addressed: Atrial fibrillation with rapid ventricular response (HCC): acute illness or injury with systemic symptoms that poses a threat to life or bodily functions New onset atrial fibrillation [facility]): acute illness or injury with systemic symptoms that poses a threat to life or bodily functions  Amount and/or Complexity of Data Reviewed Independent Historian: spouse External Data Reviewed: notes. Labs: ordered. Decision-making details documented in ED Course. Radiology: ordered and independent interpretation performed. Decision-making details documented in ED Course. ECG/medicine tests: ordered and independent interpretation performed. Decision-making details documented in ED Course. Discussion of management or test interpretation with external provider(s): cardiology  Risk Prescription drug management. Decision regarding hospitalization.   Iv ns. Continuous pulse ox and cardiac monitoring. Labs ordered/sent. Imaging ordered.   Differential diagnosis includes afib/rvr, palpitations, etc. Dispo decision including potential need for admission considered - will get labs and imaging and reassess.   Reviewed nursing notes and prior charts for additional history. External reports reviewed.   Cardiac monitor: afib, rate 150.   Cardizem bolus and gtt.   Labs reviewed/interpreted by me - hgb 13. Chem largely unremarkable. Tsh normal.   Xrays reviewed/interpreted by me - no pna.   Recheck, rate is improved, remains in afib. No chest pain. No sob.  Offered admission, shared decision making - discussed possible admission vs ED cardioversion with potential d/c.  Pt indicates does not want to be admitted to hospital, indicates is positive symptoms began this AM,  felt completely fine last night, and can tell/sense when in irregular/fast rhythm. Discussed risks, including failure to convert, pain, stroke, etc.  - pt voices understanding and would like to proceed with ED cardioversion.   While prepping patient for cardioversion, pt converted on own to nsr. Recheck no chest pain or sob.  Nsr on monitor. Cardizem gtt stopped.   Cardiology consulted. Discussed pt with Dr Loni, she recommends cardizem 120 mg a day, blood thinner therapy, and outpatient afib clinic follow up.   After ~ one hour off cardizem, remains in nsr, asymptomatic. Recheck no chest pain or sob. Nsr.   Rec close pcp f/u.  Return precautions provided.    CRITICAL CARE RE: new onset afib, afib with rvr Performed by: Tabia Landowski E Duran Ohern Total critical care time: 45 minutes Critical care time was exclusive of separately billable procedures and treating other patients. Critical care was necessary to treat or prevent imminent or life-threatening deterioration. Critical care was time spent personally by me on the following activities: development of treatment plan with patient and/or surrogate as well as nursing, discussions with consultants, evaluation of patient's response to treatment, examination of patient, obtaining history from patient or surrogate, ordering and performing treatments and interventions, ordering and review of laboratory studies, ordering and review of radiographic studies, pulse oximetry and re-evaluation of patient's condition.      Final diagnoses:  New onset atrial fibrillation [facility])  Atrial fibrillation with rapid ventricular response [facility])    ED Discharge Orders     None            Bernard Drivers, MD 12/21/23 1306

## 2023-12-30 DIAGNOSIS — I10 Essential (primary) hypertension: Secondary | ICD-10-CM | POA: Diagnosis not present

## 2023-12-30 DIAGNOSIS — I480 Paroxysmal atrial fibrillation: Secondary | ICD-10-CM | POA: Diagnosis not present

## 2023-12-30 DIAGNOSIS — E669 Obesity, unspecified: Secondary | ICD-10-CM | POA: Diagnosis not present

## 2023-12-30 DIAGNOSIS — E785 Hyperlipidemia, unspecified: Secondary | ICD-10-CM | POA: Diagnosis not present

## 2023-12-30 DIAGNOSIS — R0683 Snoring: Secondary | ICD-10-CM | POA: Diagnosis not present

## 2023-12-30 DIAGNOSIS — Z9189 Other specified personal risk factors, not elsewhere classified: Secondary | ICD-10-CM | POA: Diagnosis not present

## 2023-12-30 DIAGNOSIS — E119 Type 2 diabetes mellitus without complications: Secondary | ICD-10-CM | POA: Diagnosis not present

## 2023-12-30 DIAGNOSIS — Z7901 Long term (current) use of anticoagulants: Secondary | ICD-10-CM | POA: Diagnosis not present

## 2023-12-30 DIAGNOSIS — I872 Venous insufficiency (chronic) (peripheral): Secondary | ICD-10-CM | POA: Diagnosis not present

## 2023-12-31 ENCOUNTER — Encounter: Payer: Self-pay | Admitting: Cardiology

## 2023-12-31 ENCOUNTER — Ambulatory Visit: Attending: Cardiology | Admitting: Cardiology

## 2023-12-31 VITALS — BP 140/82 | HR 81 | Ht 64.0 in | Wt 183.0 lb

## 2023-12-31 DIAGNOSIS — E119 Type 2 diabetes mellitus without complications: Secondary | ICD-10-CM | POA: Diagnosis not present

## 2023-12-31 DIAGNOSIS — I480 Paroxysmal atrial fibrillation: Secondary | ICD-10-CM

## 2023-12-31 NOTE — Progress Notes (Signed)
 Cardiology Office Note:  .   Date:  12/31/2023  ID:  Jasmine Castro, DOB 09/23/1955, MRN [id number] PCP: Onita Rush, MD  [facility] Providers Cardiologist:  None     History of Present Illness: .   Jasmine Castro is a 68 y.o. female Discussed the use of AI scribe   History of Present Illness Jasmine Castro is a 68 year old female who presents with new onset atrial fibrillation.  She experienced palpitations and a sensation of her heart racing, which led to an emergency department visit on December 21, 2023. No prior history of heart disease is noted. She denies shortness of breath, fatigue, syncope, or chest pain. She also denies recent heavy alcohol  use.  In the emergency department, her hemoglobin was 13.7, potassium 3.9, creatinine 0.66, and TSH 1.3. An EKG showed a heart rate of 130 beats per minute with atrial fibrillation and nonspecific STT wave changes. A chest X-ray showed no pneumonia. Telemetry indicated heart rates in the 150 range.  Her father had a history of atrial fibrillation. She does not smoke and reports no alcohol  use. She is currently on metformin  for diabetes and amlodipine  for hypertension. She mentions that her blood pressure tends to rise when she is anxious or nervous.  She feels anxious, especially in the mornings, and sometimes needs to take a breather to calm down.      Studies Reviewed: .        Results LABS Hemoglobin: 13.7 g/dL (89/74/7974) Potassium: 3.9 mmol/L (12/21/2023) Creatinine: 0.66 mg/dL (89/74/7974) TSH: 1.3 mIU/L (12/21/2023)  RADIOLOGY Chest X-ray: No evidence of pneumonia (12/21/2023)  DIAGNOSTIC EKG: Heart rate 130 bpm with atrial fibrillation, nonspecific ST-T wave changes (12/21/2023) Risk Assessment/Calculations:           Physical Exam:   VS:  BP (!) 140/82   Pulse 81   Ht 5' 4 (1.626 m)   Wt 183 lb (83 kg)   SpO2 98%   BMI 31.41 kg/m    Wt Readings from Last 3 Encounters:  12/31/23 183 lb (83 kg)   12/21/23 180 lb (81.6 kg)  09/12/23 182 lb (82.6 kg)    GEN: Well nourished, well developed in no acute distress NECK: No JVD; No carotid bruits CARDIAC: RRR, no murmurs, no rubs, no gallops RESPIRATORY:  Clear to auscultation without rales, wheezing or rhonchi  ABDOMEN: Soft, non-tender, non-distended EXTREMITIES:  No edema; No deformity   ASSESSMENT AND PLAN: .    Assessment and Plan Assessment & Plan Paroxysmal atrial fibrillation New onset paroxysmal atrial fibrillation with episodes of palpitations and tachycardia. Initial heart rate was 130 bpm with irregular rhythm, later self-terminated. No prior history of heart disease, syncope, or chest pain. Family history of atrial fibrillation. Risk factors include age, diabetes, hypertension, and female sex. CHA2DS2-VASc score of 4, indicating moderate stroke risk. Discussed treatment options including diltiazem for rate control and Eliquis for stroke prevention. Discussed alternative to Eliquis, the Watchman device, but not indicated due to current tolerance of Eliquis. Discussed potential for ablation if episodes become more frequent or burdensome. Explained the role of the left atrial appendage in clot formation and the importance of anticoagulation. Discussed the use of Cardia Mobile and Apple Watch for monitoring heart rhythm. - Continue diltiazem 120 mg daily for rate control. - Continue Eliquis for stroke prevention. - Ordered echocardiogram to assess cardiac function. - Provided short-acting diltiazem for acute episodes of palpitations. - Discussed potential use of Cardia Mobile or Apple Watch for  monitoring heart rhythm. - Scheduled follow-up in six months with an advanced practice provider.  Type 2 diabetes mellitus Managed with metformin  and Jardiance . Diabetes is a risk factor for stroke in the context of atrial fibrillation.  Hypertension Managed with amlodipine . Blood pressure generally well-controlled, though anxiety can  cause transient elevations.  Anxiety  Anxiety can exacerbate palpitations and elevate blood pressure. Discussed the potential for anxiety to increase the frequency of atrial fibrillation episodes. - Provided reassurance and education on managing anxiety.         Dispo: 6 mth APP  Signed, Oneil Parchment, MD

## 2023-12-31 NOTE — Patient Instructions (Signed)
 Medication Instructions:  The current medical regimen is effective;  continue present plan and medications.  *If you need a refill on your cardiac medications before your next appointment, please call your pharmacy*  Testing/Procedures: Your physician has requested that you have an echocardiogram. Echocardiography is a painless test that uses sound waves to create images of your heart. It provides your doctor with information about the size and shape of your heart and how well your heart's chambers and valves are working. This procedure takes approximately one hour. There are no restrictions for this procedure. Please do NOT wear cologne, perfume, aftershave, or lotions (deodorant is allowed). Please arrive 15 minutes prior to your appointment time.  Please note: We ask at that you not bring children with you during ultrasound (echo/ vascular) testing. Due to room size and safety concerns, children are not allowed in the ultrasound rooms during exams. Our front office staff cannot provide observation of children in our lobby area while testing is being conducted. An adult accompanying a patient to their appointment will only be allowed in the ultrasound room at the discretion of the ultrasound technician under special circumstances. We apologize for any inconvenience.  Follow-Up: At [facility], you and your health needs are our priority.  As part of our continuing mission to provide you with exceptional heart care, our providers are all part of one team.  This team includes your primary Cardiologist (physician) and Advanced Practice Providers or APPs (Physician Assistants and Nurse Practitioners) who all work together to provide you with the care you need, when you need it.  Your next appointment:   6 month(s)  Provider:   One of our Advanced Practice Providers (APPs): Morse Clause, PA-C  Lamarr Satterfield, NP Miriam Shams, NP  Olivia Pavy, PA-C Josefa Beauvais, NP  Leontine Salen,  PA-C Orren Fabry, PA-C  Hao Meng, PA-C Ernest Dick, NP  Damien Braver, NP Jon Hails, PA-C  Waddell Donath, PA-C    Dayna Dunn, PA-C  Scott Weaver, PA-C Lum Louis, NP Katlyn West, NP Callie Goodrich, PA-C  Xika Zhao, NP Sheng Haley, PA-C    Kathleen Johnson, PA-C       We recommend signing up for the patient portal called MyChart.  Sign up information is provided on this After Visit Summary.  MyChart is used to connect with patients for Virtual Visits (Telemedicine).  Patients are able to view lab/test results, encounter notes, upcoming appointments, etc.  Non-urgent messages can be sent to your provider as well.   To learn more about what you can do with MyChart, go to [email or web address].

## 2024-01-02 ENCOUNTER — Other Ambulatory Visit (HOSPITAL_COMMUNITY): Payer: Self-pay

## 2024-01-02 ENCOUNTER — Other Ambulatory Visit: Payer: Self-pay

## 2024-01-02 MED ORDER — DILTIAZEM HCL 30 MG PO TABS: 30.0000 mg | ORAL_TABLET | Freq: Four times a day (QID) | ORAL | 6 refills | Status: DC | PRN

## 2024-01-02 MED ORDER — DILTIAZEM HCL 30 MG PO TABS
30.0000 mg | ORAL_TABLET | Freq: Four times a day (QID) | ORAL | 6 refills | Status: AC | PRN
  Filled 2024-01-02 (×2): qty 30, 8d supply, fill #0
  Filled 2024-01-13: qty 30, 8d supply, fill #1

## 2024-01-02 MED ORDER — DILTIAZEM HCL 30 MG PO TABS
30.0000 mg | ORAL_TABLET | Freq: Four times a day (QID) | ORAL | 6 refills | Status: DC | PRN
  Filled 2024-01-02: qty 30, 8d supply, fill #0

## 2024-01-02 NOTE — Addendum Note (Signed)
 Addended by: JOSHUA ANDREZ PARAS on: 01/02/2024 12:37 PM   Modules accepted: Orders

## 2024-01-13 ENCOUNTER — Other Ambulatory Visit: Payer: Self-pay

## 2024-01-15 ENCOUNTER — Other Ambulatory Visit (HOSPITAL_COMMUNITY): Payer: Self-pay

## 2024-01-15 ENCOUNTER — Other Ambulatory Visit: Payer: Self-pay

## 2024-01-15 MED ORDER — APIXABAN 5 MG PO TABS
5.0000 mg | ORAL_TABLET | Freq: Two times a day (BID) | ORAL | 1 refills | Status: AC
  Filled 2024-01-15 (×4): qty 180, 90d supply, fill #0
  Filled 2024-03-25: qty 180, 90d supply, fill #1

## 2024-01-15 MED ORDER — DILTIAZEM HCL ER COATED BEADS 120 MG PO CP24
120.0000 mg | ORAL_CAPSULE | Freq: Every day | ORAL | 3 refills | Status: AC
  Filled 2024-01-15 (×4): qty 90, 90d supply, fill #0
  Filled 2024-03-25: qty 90, 90d supply, fill #1

## 2024-01-15 NOTE — Addendum Note (Signed)
 Addended by: JOSHUA ANDREZ PARAS on: 01/15/2024 09:18 AM   Modules accepted: Orders

## 2024-01-17 ENCOUNTER — Ambulatory Visit (HOSPITAL_COMMUNITY)
Admission: RE | Admit: 2024-01-17 | Discharge: 2024-01-17 | Disposition: A | Discharge status: Home / Self Care | Source: Ambulatory Visit | Attending: Cardiology | Admitting: Cardiology

## 2024-01-17 DIAGNOSIS — I480 Paroxysmal atrial fibrillation: Secondary | ICD-10-CM | POA: Diagnosis not present

## 2024-01-17 LAB — ECHOCARDIOGRAM COMPLETE
Area-P 1/2: 4.96 cm2
S' Lateral: 3.19 cm

## 2024-01-20 ENCOUNTER — Ambulatory Visit: Payer: Self-pay | Admitting: Cardiology

## 2024-01-21 DIAGNOSIS — E119 Type 2 diabetes mellitus without complications: Secondary | ICD-10-CM | POA: Diagnosis not present

## 2024-03-04 NOTE — Progress Notes (Signed)
 Jasmine Castro                                          MRN: [id number]   03/04/2024   The [facility] Quality Team Specialist reviewed this patient medical record for the purposes of chart review for care gap closure. The following were reviewed: chart review for care gap closure-controlling blood pressure.    [facility] Quality Team

## 2024-03-25 ENCOUNTER — Other Ambulatory Visit: Payer: Self-pay

## 2024-03-25 ENCOUNTER — Other Ambulatory Visit (HOSPITAL_COMMUNITY): Payer: Self-pay

## 2024-09-14 ENCOUNTER — Inpatient Hospital Stay

## 2024-09-14 ENCOUNTER — Ambulatory Visit: Admitting: Hematology & Oncology
